# Patient Record
Sex: Male | Born: 1955
Health system: Southern US, Community
[De-identification: ages and names within clinical notes are randomized; demographics above are authoritative.]

## PROBLEM LIST (undated history)

## (undated) DIAGNOSIS — G4730 Sleep apnea, unspecified: Secondary | ICD-10-CM

## (undated) DIAGNOSIS — M545 Low back pain, unspecified: Secondary | ICD-10-CM

## (undated) DIAGNOSIS — K4090 Unilateral inguinal hernia, without obstruction or gangrene, not specified as recurrent: Secondary | ICD-10-CM

## (undated) DIAGNOSIS — M1990 Unspecified osteoarthritis, unspecified site: Secondary | ICD-10-CM

## (undated) DIAGNOSIS — S90811A Abrasion, right foot, initial encounter: Secondary | ICD-10-CM

## (undated) DIAGNOSIS — K219 Gastro-esophageal reflux disease without esophagitis: Secondary | ICD-10-CM

## (undated) DIAGNOSIS — Z8711 Personal history of peptic ulcer disease: Secondary | ICD-10-CM

## (undated) DIAGNOSIS — F419 Anxiety disorder, unspecified: Secondary | ICD-10-CM

## (undated) DIAGNOSIS — N400 Enlarged prostate without lower urinary tract symptoms: Secondary | ICD-10-CM

## (undated) DIAGNOSIS — G4733 Obstructive sleep apnea (adult) (pediatric): Secondary | ICD-10-CM

## (undated) DIAGNOSIS — K760 Fatty (change of) liver, not elsewhere classified: Secondary | ICD-10-CM

## (undated) DIAGNOSIS — F32A Depression, unspecified: Secondary | ICD-10-CM

## (undated) DIAGNOSIS — G8929 Other chronic pain: Secondary | ICD-10-CM

## (undated) DIAGNOSIS — D126 Benign neoplasm of colon, unspecified: Secondary | ICD-10-CM

## (undated) DIAGNOSIS — Z9189 Other specified personal risk factors, not elsewhere classified: Secondary | ICD-10-CM

## (undated) DIAGNOSIS — K602 Anal fissure, unspecified: Secondary | ICD-10-CM

## (undated) DIAGNOSIS — K5730 Diverticulosis of large intestine without perforation or abscess without bleeding: Secondary | ICD-10-CM

## (undated) DIAGNOSIS — Z8719 Personal history of other diseases of the digestive system: Secondary | ICD-10-CM

## (undated) DIAGNOSIS — Z860100 Personal history of colon polyps, unspecified: Secondary | ICD-10-CM

## (undated) DIAGNOSIS — K5090 Crohn's disease, unspecified, without complications: Secondary | ICD-10-CM

## (undated) HISTORY — DX: Fatty (change of) liver, not elsewhere classified: K76.0

## (undated) HISTORY — PX: EXCISIONAL HEMORRHOIDECTOMY: SHX1541

## (undated) HISTORY — DX: Unilateral inguinal hernia, without obstruction or gangrene, not specified as recurrent: K40.90

## (undated) HISTORY — PX: TONSILLECTOMY AND ADENOIDECTOMY: SUR1326

## (undated) HISTORY — DX: Sleep apnea, unspecified: G47.30

## (undated) HISTORY — DX: Unspecified osteoarthritis, unspecified site: M19.90

## (undated) HISTORY — DX: Anal fissure, unspecified: K60.2

## (undated) HISTORY — DX: Depression, unspecified: F32.A

## (undated) HISTORY — DX: Gastro-esophageal reflux disease without esophagitis: K21.9

## (undated) HISTORY — DX: Benign neoplasm of colon, unspecified: D12.6

## (undated) HISTORY — PX: KNEE ARTHROSCOPY: SUR90

---

## 1991-04-07 HISTORY — PX: OTHER SURGICAL HISTORY: SHX169

## 1992-04-06 HISTORY — PX: LUMBAR DISC SURGERY: SHX700

## 1998-04-06 HISTORY — PX: INCISIONAL HERNIA REPAIR: SHX193

## 1998-12-17 ENCOUNTER — Ambulatory Visit (HOSPITAL_COMMUNITY): Admission: RE | Admit: 1998-12-17 | Discharge: 1998-12-18 | Payer: Self-pay

## 1999-04-07 HISTORY — PX: POPLITEAL SYNOVIAL CYST EXCISION: SUR555

## 1999-11-04 ENCOUNTER — Encounter: Admission: RE | Admit: 1999-11-04 | Discharge: 1999-11-04 | Payer: Self-pay

## 1999-11-06 ENCOUNTER — Encounter: Admission: RE | Admit: 1999-11-06 | Discharge: 1999-11-06 | Payer: Self-pay

## 2001-04-15 ENCOUNTER — Encounter: Payer: Self-pay | Admitting: Internal Medicine

## 2006-07-14 ENCOUNTER — Ambulatory Visit: Payer: Self-pay | Admitting: Internal Medicine

## 2006-07-14 LAB — CONVERTED CEMR LAB
ALT: 28 units/L (ref 0–40)
AST: 29 units/L (ref 0–37)
Albumin: 4.4 g/dL (ref 3.5–5.2)
Alkaline Phosphatase: 58 units/L (ref 39–117)
BUN: 15 mg/dL (ref 6–23)
Basophils Absolute: 0.3 10*3/uL — ABNORMAL HIGH (ref 0.0–0.1)
Basophils Relative: 2.2 % — ABNORMAL HIGH (ref 0.0–1.0)
Bilirubin, Direct: 0.1 mg/dL (ref 0.0–0.3)
CO2: 32 meq/L (ref 19–32)
Calcium: 9.8 mg/dL (ref 8.4–10.5)
Chloride: 107 meq/L (ref 96–112)
Creatinine, Ser: 1 mg/dL (ref 0.4–1.5)
Eosinophils Absolute: 0.3 10*3/uL (ref 0.0–0.6)
Eosinophils Relative: 2.3 % (ref 0.0–5.0)
GFR calc Af Amer: 101 mL/min
GFR calc non Af Amer: 84 mL/min
Glucose, Bld: 97 mg/dL (ref 70–99)
HCT: 46.9 % (ref 39.0–52.0)
Hemoglobin: 16.1 g/dL (ref 13.0–17.0)
Lymphocytes Relative: 33.7 % (ref 12.0–46.0)
MCHC: 34.2 g/dL (ref 30.0–36.0)
MCV: 88.5 fL (ref 78.0–100.0)
Monocytes Absolute: 0.9 10*3/uL — ABNORMAL HIGH (ref 0.2–0.7)
Monocytes Relative: 7.8 % (ref 3.0–11.0)
Neutro Abs: 6.3 10*3/uL (ref 1.4–7.7)
Neutrophils Relative %: 54 % (ref 43.0–77.0)
Platelets: 261 10*3/uL (ref 150–400)
Potassium: 4 meq/L (ref 3.5–5.1)
RBC: 5.3 M/uL (ref 4.22–5.81)
RDW: 12.6 % (ref 11.5–14.6)
Sed Rate: 5 mm/hr (ref 0–20)
Sodium: 144 meq/L (ref 135–145)
TSH: 1.89 microintl units/mL (ref 0.35–5.50)
Total Bilirubin: 0.8 mg/dL (ref 0.3–1.2)
Total Protein: 7.1 g/dL (ref 6.0–8.3)
WBC: 11.8 10*3/uL — ABNORMAL HIGH (ref 4.5–10.5)

## 2006-07-20 ENCOUNTER — Ambulatory Visit: Payer: Self-pay | Admitting: *Deleted

## 2006-07-21 ENCOUNTER — Ambulatory Visit: Payer: Self-pay | Admitting: Internal Medicine

## 2009-01-01 ENCOUNTER — Telehealth: Payer: Self-pay | Admitting: Internal Medicine

## 2009-01-03 ENCOUNTER — Telehealth: Payer: Self-pay | Admitting: Internal Medicine

## 2009-01-23 DIAGNOSIS — K5090 Crohn's disease, unspecified, without complications: Secondary | ICD-10-CM | POA: Insufficient documentation

## 2009-01-23 DIAGNOSIS — K5730 Diverticulosis of large intestine without perforation or abscess without bleeding: Secondary | ICD-10-CM | POA: Insufficient documentation

## 2009-01-29 ENCOUNTER — Ambulatory Visit: Payer: Self-pay | Admitting: Internal Medicine

## 2009-01-29 DIAGNOSIS — K219 Gastro-esophageal reflux disease without esophagitis: Secondary | ICD-10-CM

## 2010-04-15 ENCOUNTER — Encounter: Payer: Self-pay | Admitting: Internal Medicine

## 2010-05-08 NOTE — Letter (Signed)
Summary: Colonoscopy Letter  [facility]  [place], [place] [address]   Phone: [phone]  Fax: [phone]      April 15, 2010 MRN: [id number]   ZYMARION FAVORITE [address] [place], [place]  [address]   Dear Mr. SCHWARTZ,   According to your medical record, it is time for you to schedule a Colonoscopy. The American Cancer Society recommends this procedure as a method to detect early colon cancer. Patients with a family history of colon cancer, or a personal history of colon polyps or inflammatory bowel disease are at increased risk.  This letter has been generated based on the recommendations made at the time of your procedure. If you feel that in your particular situation this may no longer apply, please contact our office.  Please call our office at [phone] to schedule this appointment or to update your records at your earliest convenience.  Thank you for cooperating with [place] to provide you with the very best care possible.   Sincerely,  Lowella Bandy. Olevia Perches, M.D.  [facility] Gastroenterology Division [phone]

## 2010-07-04 ENCOUNTER — Encounter (INDEPENDENT_AMBULATORY_CARE_PROVIDER_SITE_OTHER): Payer: Self-pay | Admitting: *Deleted

## 2010-07-08 NOTE — Letter (Signed)
Summary: Pre Visit Letter Revised  [facility]  [place], [place] [address]   Phone: [phone]  Fax: [phone]        07/04/2010 MRN: [id number] Micheal [name] [address] [place], [place]  [address]             Procedure Date:  07/21/2010 @ 3:30   recall colon-Dr. Olevia Perches   Welcome to the Gastroenterology Division at [facility].    You are scheduled to see a nurse for your pre-procedure visit on 07/09/2010 at 1:30 on the 3rd floor at [facility], [employer].  We ask that you try to arrive at our office 15 minutes prior to your appointment time to allow for check-in.  Please take a minute to review the attached form.  If you answer "Yes" to one or more of the questions on the first page, we ask that you call the person listed at your earliest opportunity.  If you answer "No" to all of the questions, please complete the rest of the form and bring it to your appointment.    Your nurse visit will consist of discussing your medical and surgical history, your immediate family medical history, and your medications.   If you are unable to list all of your medications on the form, please bring the medication bottles to your appointment and we will list them.  We will need to be aware of both prescribed and over the counter drugs.  We will need to know exact dosage information as well.    Please be prepared to read and sign documents such as consent forms, a financial agreement, and acknowledgement forms.  If necessary, and with your consent, a friend or relative is welcome to sit-in on the nurse visit with you.  Please bring your insurance card so that we may make a copy of it.  If your insurance requires a referral to see a specialist, please bring your referral form from your primary care physician.  No co-pay is required for this nurse visit.     If you cannot keep your appointment, please call [phone] to cancel or reschedule prior to your  appointment date.  This allows [place] the opportunity to schedule an appointment for another patient in need of care.    Thank you for choosing [facility] for your medical needs.  We appreciate the opportunity to care for you.  Please visit [place] at our website  to learn more about our practice.  Sincerely, The Gastroenterology Division

## 2010-07-09 ENCOUNTER — Ambulatory Visit (AMBULATORY_SURGERY_CENTER): Payer: PRIVATE HEALTH INSURANCE | Admitting: *Deleted

## 2010-07-09 VITALS — Ht 74.0 in | Wt 228.5 lb

## 2010-07-09 DIAGNOSIS — Z1211 Encounter for screening for malignant neoplasm of colon: Secondary | ICD-10-CM

## 2010-07-09 MED ORDER — PEG-KCL-NACL-NASULF-NA ASC-C 100 G PO SOLR: ORAL | Status: DC

## 2010-07-15 ENCOUNTER — Telehealth: Payer: Self-pay | Admitting: Internal Medicine

## 2010-07-15 MED ORDER — DICYCLOMINE HCL 20 MG PO TABS: ORAL_TABLET | ORAL | Status: DC

## 2010-07-15 NOTE — Telephone Encounter (Signed)
Patient calling to report for the last couple of weeks, left sided pain beside his belly button. Reports that pain is there all the time but his stomach makes lots of "noise when I eat." States bowel movements a couple of weeks ago were black but are normal now. Denies diarrhea, constipation, bleeding, nausea or vomiting. States he stopped taking Protonix when he lost his insurance. He has tried Prilosec but it did not help. Takes Percocet 10/340m every 4 hours as needed for pain (Dr. MNicholaus Bloom States he does have some supplemental insurance and is currently waiting on disaiblity to come through. He is scheduled for  Colonoscopy on 09/04/10. Hx Crohn's s/p post terminal ileum resection in 1993. Please, advise.

## 2010-07-15 NOTE — Telephone Encounter (Signed)
Patient given Dr. Nichola Sizer recommendations. Rx sent to pharmacy.

## 2010-07-15 NOTE — Telephone Encounter (Signed)
Please ask for him to take Activia yogurt or a probiotic, also call in Bentyl 20 mg, #20 , 1 po bid. If not better, will consider adding EGD to his colonoscopy in May 2012

## 2010-07-21 ENCOUNTER — Other Ambulatory Visit: Payer: Self-pay | Admitting: Internal Medicine

## 2010-07-29 ENCOUNTER — Other Ambulatory Visit: Payer: Self-pay | Admitting: Internal Medicine

## 2010-07-30 ENCOUNTER — Telehealth: Payer: Self-pay | Admitting: Internal Medicine

## 2010-07-30 ENCOUNTER — Ambulatory Visit (INDEPENDENT_AMBULATORY_CARE_PROVIDER_SITE_OTHER)
Admission: RE | Admit: 2010-07-30 | Discharge: 2010-07-30 | Disposition: A | Discharge status: Home / Self Care | Payer: PRIVATE HEALTH INSURANCE | Source: Ambulatory Visit | Attending: Internal Medicine | Admitting: Internal Medicine

## 2010-07-30 DIAGNOSIS — K5090 Crohn's disease, unspecified, without complications: Secondary | ICD-10-CM

## 2010-07-30 DIAGNOSIS — R109 Unspecified abdominal pain: Secondary | ICD-10-CM

## 2010-07-30 NOTE — Telephone Encounter (Signed)
Patient notified of Dr. Nichola Sizer recommendation. He will come today for KUB. Order in [facility]

## 2010-07-30 NOTE — Telephone Encounter (Signed)
Spoke with patient. He states that this AM, he is having sweats, chills and left lower abdominal pain. Denies bleeding, diarrhea, nausea. States he has had 2-3 small bowel movements this AM and feels almost like he is constipated. States he had vomiting x 1 this AM that was clear emesis. He is using Bentyl. Hx Crohn's disease with ileocolic anastomosis s/p terminal ileum resection 1993, s/p ventral hernia repair. Last colon- 04/15/01- no evidence recurrent Crohn's, diverticulosis. Patient scheduled with Nicoletta Ba, PA on 07/31/10 at 9:30 AM(Brodie supervising).

## 2010-07-30 NOTE — Telephone Encounter (Signed)
Please order KUB r/o SBO, " hx of Crohn's"

## 2010-07-31 ENCOUNTER — Other Ambulatory Visit (INDEPENDENT_AMBULATORY_CARE_PROVIDER_SITE_OTHER): Payer: PRIVATE HEALTH INSURANCE

## 2010-07-31 ENCOUNTER — Ambulatory Visit (INDEPENDENT_AMBULATORY_CARE_PROVIDER_SITE_OTHER): Payer: PRIVATE HEALTH INSURANCE | Admitting: Physician Assistant

## 2010-07-31 ENCOUNTER — Encounter: Payer: Self-pay | Admitting: Physician Assistant

## 2010-07-31 ENCOUNTER — Ambulatory Visit (INDEPENDENT_AMBULATORY_CARE_PROVIDER_SITE_OTHER)
Admission: RE | Admit: 2010-07-31 | Discharge: 2010-07-31 | Disposition: A | Discharge status: Home / Self Care | Payer: PRIVATE HEALTH INSURANCE | Source: Ambulatory Visit | Attending: Physician Assistant | Admitting: Physician Assistant

## 2010-07-31 VITALS — BP 132/80 | HR 74 | Temp 98.7°F | Ht 74.0 in | Wt 217.0 lb

## 2010-07-31 DIAGNOSIS — K5010 Crohn's disease of large intestine without complications: Secondary | ICD-10-CM

## 2010-07-31 DIAGNOSIS — R110 Nausea: Secondary | ICD-10-CM

## 2010-07-31 DIAGNOSIS — G8929 Other chronic pain: Secondary | ICD-10-CM | POA: Insufficient documentation

## 2010-07-31 DIAGNOSIS — N400 Enlarged prostate without lower urinary tract symptoms: Secondary | ICD-10-CM | POA: Insufficient documentation

## 2010-07-31 DIAGNOSIS — R634 Abnormal weight loss: Secondary | ICD-10-CM

## 2010-07-31 DIAGNOSIS — F419 Anxiety disorder, unspecified: Secondary | ICD-10-CM | POA: Insufficient documentation

## 2010-07-31 DIAGNOSIS — F411 Generalized anxiety disorder: Secondary | ICD-10-CM

## 2010-07-31 DIAGNOSIS — R1032 Left lower quadrant pain: Secondary | ICD-10-CM

## 2010-07-31 DIAGNOSIS — M549 Dorsalgia, unspecified: Secondary | ICD-10-CM

## 2010-07-31 LAB — CBC WITH DIFFERENTIAL/PLATELET
Basophils Relative: 0.3 % (ref 0.0–3.0)
Eosinophils Absolute: 0.1 10*3/uL (ref 0.0–0.7)
HCT: 47 % (ref 39.0–52.0)
Hemoglobin: 16.2 g/dL (ref 13.0–17.0)
Lymphs Abs: 3.6 10*3/uL (ref 0.7–4.0)
MCHC: 34.5 g/dL (ref 30.0–36.0)
MCV: 93.2 fl (ref 78.0–100.0)
Monocytes Absolute: 1.5 10*3/uL — ABNORMAL HIGH (ref 0.1–1.0)
Neutro Abs: 11.7 10*3/uL — ABNORMAL HIGH (ref 1.4–7.7)
Neutrophils Relative %: 69 % (ref 43.0–77.0)
RBC: 5.05 Mil/uL (ref 4.22–5.81)

## 2010-07-31 LAB — BASIC METABOLIC PANEL
CO2: 29 mEq/L (ref 19–32)
Calcium: 9.7 mg/dL (ref 8.4–10.5)
Chloride: 101 mEq/L (ref 96–112)
Glucose, Bld: 108 mg/dL — ABNORMAL HIGH (ref 70–99)
Sodium: 139 mEq/L (ref 135–145)

## 2010-07-31 MED ORDER — CIPROFLOXACIN HCL 500 MG PO TABS: 500.0000 mg | ORAL_TABLET | Freq: Two times a day (BID) | ORAL | Status: AC

## 2010-07-31 MED ORDER — METRONIDAZOLE 500 MG PO TABS: 500.0000 mg | ORAL_TABLET | Freq: Two times a day (BID) | ORAL | Status: AC

## 2010-07-31 MED ORDER — IOHEXOL 300 MG/ML  SOLN
100.0000 mL | Freq: Once | INTRAMUSCULAR | Status: AC | PRN
  Administered 2010-07-31: 100 mL via INTRAVENOUS

## 2010-07-31 NOTE — Progress Notes (Signed)
Reviewed and agree with management. Joylene Igo. MD [facility]

## 2010-07-31 NOTE — Progress Notes (Signed)
Subjective:    Patient ID: Micheal [name], male    DOB: Apr 03, 1956, 55 y.o.   MRN: [id number]  HPI Micheal [name] is a pleasant 55 year old white male known to Dr. Elicia Lamp. He was diagnosed with Crohn's ileitis in 1993 at which time he underwent resection of the terminal ileum. He also has history of diverticulosis. He has not had any problems with active Crohn since the time of his surgery. His last colonoscopy was done in 2003 again with no evidence of recurrent Crohn's. He was last seen here in October of 2010 and at that time was doing well, and advised to have followup colonoscopy this year. He is actually scheduled for followup colonoscopy may 28th.  Patient comes in today with onset of abdominal pain over the past 2-3 weeks. He says that discomfort has been fairly constant Sundays worse than others and worse over the past few days. He had onset 2 nights ago with sweats and chills but no documented fever. He says again last night he had sweats and chills and generally doesn't feel well. He has been fatigued his appetite has been decreased and he feels that he has an increase in pain after by mouth intake. He has lost about 8 pounds over the past 2 weeks. He is having bowel movements about is passing just a small amount of stool 3-4 times per day. He has not noted any melena or hematochezia. His pain is primarily in the left lower quadrant at this time. When he called he was set up for a KUB which was done yesterday and is not show any definite evidence of obstruction. He does feel nauseated at times but has not had any vomiting.    Review of Systems  Constitutional: Positive for appetite change, fatigue and unexpected weight change.  HENT: Negative.   Eyes: Negative.   Respiratory: Negative.   Cardiovascular: Negative.   Gastrointestinal: Positive for abdominal pain.  Genitourinary: Positive for difficulty urinating.  Musculoskeletal: Positive for back pain.  Skin: Negative.   Neurological:  Negative.   Hematological: Negative.   Psychiatric/Behavioral: Negative.    Outpatient Prescriptions Prior to Visit  Medication Sig Dispense Refill  . diazepam (VALIUM) 5 MG tablet Take 5 mg by mouth at bedtime as needed. Sometimes takes 2 tablets       . dicyclomine (BENTYL) 20 MG tablet TAKE 1 TABLET BY MOUTH TWICE Micheal [name]  20 tablet  0  . loratadine-pseudoephedrine (CLARITIN-D 24-HOUR) 10-240 MG per 24 hr tablet Take 1 tablet by mouth Micheal [name]. AS NEEDED      . omeprazole (PRILOSEC) 20 MG capsule Take 20 mg by mouth as needed.        Marland Kitchen oxyCODONE-acetaminophen (PERCOCET) 10-325 MG per tablet Take 1 tablet by mouth every 4 (four) hours as needed.        . Tamsulosin HCl (FLOMAX) 0.4 MG CAPS Take 0.4 mg by mouth at bedtime.        . peg 3350 powder (MOVIPREP) 100 G SOLR Movi Prep as directed  1 kit  0       Objective:   Physical Exam Well-developed white male in no acute distress, pleasant, alert and oriented x3 HEENT nontraumatic normocephalic EOMI PERRLA sclera anicteric  Neck; supple no JVD  Cardiovascula;r regular rate and rhythm with S1 and S2 no murmur rub or gallop Pulmonary; few scattered rhonchi  Abdomen ; soft nondistended, lower midline abdominal incisional scar, he is tender in the left lower quadrant and suprapubic area there is no  palpable mass or hepatosplenomegaly no guarding or rebound  Rectal; not done Skin benign warm and dry no lesion  Psych; mood and affect normal an appropriate       Assessment & Plan:  #7 55 year old white male with history of Crohn's ileocolitis status post resection in 1993 with no documented evidence of recurrent disease since. Now with 2-3 week history of somewhat progressive left lower quadrant pain, change in bowel habits weight loss and 2-3 day history of intermittent chills and sweats. Rule out active Crohn's, rule out diverticulitis.  Plan; Will check CBC CRP and be met today Schedule for CT scan of the abdomen and pelvis today if possible Start  Cipro 500 mg by mouth twice Micheal [name] x14 days Start Flagyl 500 mg twice Micheal [name] x14 days Advised for liquids a very soft diet, patient has Percocet which he uses for his back to use as needed for pain, further plans pending results of CT.

## 2010-07-31 NOTE — Patient Instructions (Signed)
Please go to the basement level to have your labs drawn.  We have scheduled the CT scan at [address] [employer]. Today 07-31-2010 at 2:30 PM. ARRIVE AT 2:15 PM Directions provided. We sent prescriptions for Cipro and Flagyl to [facility]

## 2010-08-01 ENCOUNTER — Telehealth: Payer: Self-pay | Admitting: *Deleted

## 2010-08-01 NOTE — Telephone Encounter (Signed)
Spoke with patient and he states he is feeling better today.  Scheduled patient for f/u on 08/13/10 2:30 PM with Dr. Olevia Perches.

## 2010-08-01 NOTE — Telephone Encounter (Signed)
Message copied by Leone Payor on Fri Aug 01, 2010  8:22 AM ------      Message from: [place], [place]      Created: Thu Jul 31, 2010  8:03 PM       Pt has diverticulitis. I believe pam  Called him with his ct report today. Please check on him tomorrow, and make sure he gets a follow up in 2 weeks with dr Olevia Perches

## 2010-08-11 ENCOUNTER — Encounter: Payer: Self-pay | Admitting: Internal Medicine

## 2010-08-13 ENCOUNTER — Ambulatory Visit (INDEPENDENT_AMBULATORY_CARE_PROVIDER_SITE_OTHER): Payer: PRIVATE HEALTH INSURANCE | Admitting: Internal Medicine

## 2010-08-13 ENCOUNTER — Encounter: Payer: Self-pay | Admitting: Internal Medicine

## 2010-08-13 VITALS — BP 134/80 | HR 62 | Ht 74.0 in | Wt 220.0 lb

## 2010-08-13 DIAGNOSIS — K5732 Diverticulitis of large intestine without perforation or abscess without bleeding: Secondary | ICD-10-CM

## 2010-08-13 DIAGNOSIS — R1032 Left lower quadrant pain: Secondary | ICD-10-CM

## 2010-08-13 DIAGNOSIS — K5010 Crohn's disease of large intestine without complications: Secondary | ICD-10-CM

## 2010-08-13 NOTE — Progress Notes (Signed)
Micheal [name] 28-May-1955 MRN [id number]   History of Present Illness:  This is a 55 year old, white male with Crohn's disease who is status post terminal ileal resection in 1993. He is also post ventral hernia repair. He presented with acute left lower quadrant abdominal pain on 07/31/2010 and was found to have acute diverticulitis on a CT scan of the abdomen and pelvis. There was no abscess but his white cell count was 17,000 and CRP was greater than 80. His last colonoscopy in 2003 showed no evidence of Crohn's disease. He was treated with Cipro and Flagyl for 2 weeks and has 90% resolution of his symptoms. His bowel habits have been regular. He has occasional dyspepsia relieved by PPI's. He goes to the pain clinic for chronic low back pain. He follows up with Dr. Hardin Negus. He is in the process of applying for disability.   Past Medical History  Diagnosis Date  . Allergy   . GERD (gastroesophageal reflux disease)   . Crohn's   . Diverticulosis of colon (without mention of hemorrhage)   . Pelvic abscess    Past Surgical History  Procedure Date  . Popliteal synovial cyst excision     both kness  . Colonoscopy   . Polypectomy   . Small intestine surgery   . Back surgery     L4/5  . Hernia repair     lt. groin  . Incisional hernia repair   . Rt. knee arthroscopy     x4  . Excisional hemorrhoidectomy   . Tonsillectomy and adenoidectomy   . Appendectomy   . Colon surgery      resection of terminal ileum/right colon 1993/crohns    reports that he has never smoked. He does not have any smokeless tobacco history on file. He reports that he drinks alcohol. He reports that he does not use illicit drugs. family history includes Cirrhosis in his mother and Heart disease in his father.  There is no history of Colon cancer. Allergies  Allergen Reactions  . Codeine     REACTION: itching        Review of Systems: Denies any dysphagia or odynophagia. Denies any shortness of breath or  chest pain. Positive for abdominal pain and small amount of residual tenderness in left lower quadrant.  The remainder of the 10  point ROS is negative except as outlined in H&P   Physical Exam: General appearance  Well developed, in no distress. Eyes- non icteric. HEENT nontraumatic, normocephalic. Mouth no lesions, tongue papillated, no cheilosis Neck supple without adenopathy, thyroid not enlarged, no carotid bruits, no JVD Lungs Clear to auscultation bilaterally Cor normal S1 normal S2, regular rhythm , no murmur,  quiet precordium Abdomen soft relaxed abdomen habits of ventral hernia above the umbilicus. Normoactive bowel sounds. Tenderness in left lower quadrant but no rebound or mass, liver edge at costal margin. Rectal: Soft Hemoccult negative stool. Extremities no pedal edema. Skin no lesions. Neurological alert and oriented x 3. Psychological normal mood and affect.  Assessment and    Problem #1 diverticulitis. He has symptomatically improved on Cipro and Flagyl. He will gradually increase fiber content of his diet. He will be finished with the antibiotics in 2 days. He is due for a repeat colonoscopy. We will await complete resolution of the tenderness before doing colonoscopy.   Problem #2 gastroesophageal reflux. Patient was given samples of Dexilant.  Problem #3 chronic back pain. Patient is to follow up in the pain clinic with Dr. Nicholaus Bloom.  08/13/2010 Delfin Edis

## 2010-08-13 NOTE — Patient Instructions (Addendum)
Follow up as needed CC: Dr Nicholaus Bloom, Dr Charleston Poot

## 2010-08-22 NOTE — Assessment & Plan Note (Signed)
[facility]                         GASTROENTEROLOGY OFFICE NOTE   JIBRIL, MCMINN                        MRN:          [id number]  DATE:07/14/2006                            DOB:          30-Mar-1956    Mr. Stults is a 55 year old gentleman with chronic disease of the  terminal ileum, status post terminal ileal resection by Dr. Leafy Kindle in  1993.  Last colonoscopy in 2003 did not show any evidence of recurrent  Crohn's disease.  He had a repair of ventral hernia in 1999.  He also  has a lumbar disk disease and was operated on by Dr. Ellene Route.  He is here  today because first of all, he received a recall letter for colonoscopy,  but he has also been losing weight from usual 216 pounds to about 190  pounds.  He also is having diarrhea, having loose stools during the day  and crampy abdominal pain.  He denies any rectal bleeding.  He has had  early satiety and dyspepsia.  We saw him last time in June, 2005.   MEDICATIONS:  Celebrex 200 mg a day for his degenerative joint disease.   PHYSICAL EXAMINATION:  VITAL SIGNS:  Blood pressure 140/90, pulse 76.  Weight 200 pounds.  GENERAL:  He was thinner than I saw him last time.  He was alert and  oriented.  HEENT:  Sclerae are anicteric.  NECK:  Supple.  LUNGS:  Clear to auscultation.  COR:  Normal S1, normal S2.  ABDOMEN:  Soft but very tender diffusely, mostly in the left lower and  left upper quadrant.  There were normoactive bowel sounds.  No fluid  wave.  The liver edge is at the costal margin, status post a well-healed  surgical scar in the right lower quadrant.  No CVA tenderness.  RECTAL:  Normal rectal tone.  Stool was hemoccult negative.  No perineal  disease.   IMPRESSION:  A 55 year old white male with Crohn's disease of the distal  small bowel, status post terminal ileal resection.  Last colonoscopy was  in 2003.  Patient now has symptoms which are suggestive of either  partial small bowel  obstruction or diarrhea due to bacterial overgrowth  or due to active Crohn's disease.  His weight loss may be related to  poor absorption or just active Crohn's disease.  He has not really taken  any preventative medications for his Crohn's.   PLAN:  1. CT scan of the abdomen and pelvis to look for active Crohn's      disease.  2. Start Cipro 250 po b.i.d.  Patient responded to it in the past.  3. Begin prednisone 20 mg daily.  He also responded to it in the past.  4. Today, CBC, CMET, sed rate.  5. I will need to see him in about six weeks.     Lowella Bandy. Olevia Perches, MD  Electronically Signed    DMB/MedQ  DD: 07/14/2006  DT: 07/15/2006  Job #: [id number]   cc:   Elta Guadeloupe L. Hardin Negus, M.D.

## 2010-09-03 ENCOUNTER — Telehealth: Payer: Self-pay | Admitting: Internal Medicine

## 2010-09-03 NOTE — Telephone Encounter (Signed)
No cancellation fee

## 2010-09-04 ENCOUNTER — Other Ambulatory Visit: Payer: Self-pay | Admitting: Internal Medicine

## 2010-11-20 NOTE — Progress Notes (Signed)
Addended by: Maxie Barb on: 11/20/2010 12:17 PM   Modules accepted: Orders, Level of Service

## 2011-07-13 ENCOUNTER — Other Ambulatory Visit: Payer: Self-pay | Admitting: Anesthesiology

## 2011-07-13 DIAGNOSIS — M5416 Radiculopathy, lumbar region: Secondary | ICD-10-CM

## 2011-07-15 ENCOUNTER — Ambulatory Visit
Admission: RE | Admit: 2011-07-15 | Discharge: 2011-07-15 | Disposition: A | Discharge status: Home / Self Care | Payer: Medicare Other | Source: Ambulatory Visit | Attending: Anesthesiology | Admitting: Anesthesiology

## 2011-07-15 DIAGNOSIS — M5416 Radiculopathy, lumbar region: Secondary | ICD-10-CM

## 2011-07-15 MED ORDER — GADOBENATE DIMEGLUMINE 529 MG/ML IV SOLN
20.0000 mL | Freq: Once | INTRAVENOUS | Status: AC | PRN
  Administered 2011-07-15: 20 mL via INTRAVENOUS

## 2011-09-22 ENCOUNTER — Other Ambulatory Visit: Payer: Self-pay | Admitting: *Deleted

## 2011-09-22 ENCOUNTER — Telehealth: Payer: Self-pay | Admitting: Internal Medicine

## 2011-09-22 DIAGNOSIS — K5790 Diverticulosis of intestine, part unspecified, without perforation or abscess without bleeding: Secondary | ICD-10-CM

## 2011-09-22 MED ORDER — METRONIDAZOLE 250 MG PO TABS: 250.0000 mg | ORAL_TABLET | Freq: Three times a day (TID) | ORAL | Status: AC

## 2011-09-22 MED ORDER — CIPROFLOXACIN HCL 500 MG PO TABS: 500.0000 mg | ORAL_TABLET | Freq: Two times a day (BID) | ORAL | Status: AC

## 2011-09-22 NOTE — Telephone Encounter (Signed)
Pl;ease send Cipro 563m po bid #20,, Flagyl 250 mg po tid x 10 days. #30. He will need to schedule colonoscopy within next 6 weeks.

## 2011-09-22 NOTE — Telephone Encounter (Signed)
Patient reports he ate strawberries a couple of weeks ago and now has abdominal pain. States the pain is bellow the belly button and to the left side. Report it wakes him up and is a nagging pain. Slight tender to touch. Reports indigestion and slightly constipated. Denies fever. States it is like the pain he had about a year ago with diverticulitis. He is asking for antibiotics for this. Please, advise.

## 2011-09-22 NOTE — Telephone Encounter (Signed)
rx sent to pharmacy. Patient aware. Scheduled patient for colonoscopy on 11/04/11 at 3:30 PM and pre visit on 10/14/11 at 11:00 AM.

## 2011-10-14 ENCOUNTER — Ambulatory Visit (AMBULATORY_SURGERY_CENTER): Payer: Medicare Other | Admitting: *Deleted

## 2011-10-14 VITALS — Ht 74.0 in | Wt 222.2 lb

## 2011-10-14 DIAGNOSIS — Z8719 Personal history of other diseases of the digestive system: Secondary | ICD-10-CM

## 2011-10-14 DIAGNOSIS — R1032 Left lower quadrant pain: Secondary | ICD-10-CM

## 2011-10-14 MED ORDER — MOVIPREP 100 G PO SOLR: ORAL | Status: DC

## 2011-11-04 ENCOUNTER — Ambulatory Visit (AMBULATORY_SURGERY_CENTER): Payer: Medicare Other | Admitting: Internal Medicine

## 2011-11-04 ENCOUNTER — Encounter: Payer: Self-pay | Admitting: Internal Medicine

## 2011-11-04 VITALS — BP 123/93 | HR 75 | Temp 96.0°F | Resp 19 | Ht 74.0 in | Wt 220.0 lb

## 2011-11-04 DIAGNOSIS — Z8719 Personal history of other diseases of the digestive system: Secondary | ICD-10-CM

## 2011-11-04 DIAGNOSIS — D126 Benign neoplasm of colon, unspecified: Secondary | ICD-10-CM

## 2011-11-04 DIAGNOSIS — K5090 Crohn's disease, unspecified, without complications: Secondary | ICD-10-CM

## 2011-11-04 DIAGNOSIS — R1032 Left lower quadrant pain: Secondary | ICD-10-CM

## 2011-11-04 DIAGNOSIS — K319 Disease of stomach and duodenum, unspecified: Secondary | ICD-10-CM

## 2011-11-04 MED ORDER — SODIUM CHLORIDE 0.9 % IV SOLN: 500.0000 mL | INTRAVENOUS | Status: DC

## 2011-11-04 NOTE — Op Note (Signed)
[place] [facility] [employer]. [place], [place]  [address]  COLONOSCOPY PROCEDURE REPORT  PATIENT:  Micheal [name], Micheal [name]  MR#:  [id number] BIRTHDATE:  07-Mar-1956, 89 yrs. old  GENDER:  male ENDOSCOPIST:  Lowella Bandy. Olevia Perches, MD REF. BY:  Florina Ou, M.D. PROCEDURE DATE:  11/04/2011 PROCEDURE:  Colonoscopy with biopsy ASA CLASS:  Class II INDICATIONS:  diverticulitis, colorectal cancer screening, average risk, Crohn's disease hx TI resection for Crohn's disease 1993, diverticulitis on CT scan [address],2013 last colon 2003- no active Crohn's MEDICATIONS:   MAC sedation, administered by CRNA, propofol (Diprivan) 350 mg  DESCRIPTION OF PROCEDURE:   After the risks and benefits and of the procedure were explained, informed consent was obtained. Digital rectal exam was performed and revealed no rectal masses. The LB [id number] endoscope was introduced through the anus and advanced to the anastomosis.  The quality of the prep was excellent, using MoviPrep.  The instrument was then slowly withdrawn as the colon was fully examined. <<PROCEDUREIMAGES>>  FINDINGS:  The right colon was surgically resected and an ileo-colonic anastamosis was seen. widely patent anastomosis With standard forceps, biopsy was obtained and sent to pathology (see image3 and image6).  Three polyps were found. 3-8 mm sessile polyp right colon and one at 60 cm With hot biopsy forceps, biopsy was obtained and sent to pathology (see image8, image9, and image4). Severe diverticulosis was found in the sigmoid colon (see image1, image2, and image10). severe diverticular disease sigmoid colon, lumen narrowed, deep diverticuli also mild diverticulosis in the right colon  This was otherwise a normal examination of the colon (see image11, image6, and image3). Retroflexed views in the rectum revealed no abnormalities.    The scope was then withdrawn from the patient and the procedure completed.  COMPLICATIONS:   None ENDOSCOPIC IMPRESSION: 1) Prior right hemi-colectomy 2) Three polyps 3) Severe diverticulosis in the sigmoid colon 4) Otherwise normal examination RECOMMENDATIONS: 1) Await pathology results metamucil 1 tsp daily high fiber diet may need sigmoid resection - lap assisted if diverticulitis recurrs.  REPEAT EXAM:  In 5 year(s) for.  ______________________________ Lowella Bandy. Olevia Perches, MD  CC:  n. eSIGNED:   Lowella Bandy. Dominyk Law at 11/04/2011 04:05 PM  Silvio Clayman, [phone]

## 2011-11-04 NOTE — Progress Notes (Signed)
Patient did not experience any of the following events: a burn prior to discharge; a fall within the facility; wrong site/side/patient/procedure/implant event; or a hospital transfer or hospital admission upon discharge from the facility. (G8907) Patient did not have preoperative order for IV antibiotic SSI prophylaxis. (G8918)  

## 2011-11-04 NOTE — Patient Instructions (Addendum)
YOU HAD AN ENDOSCOPIC PROCEDURE TODAY AT [facility]: Refer to the procedure report that was given to you for any specific questions about what was found during the examination.  If the procedure report does not answer your questions, please call your gastroenterologist to clarify.  If you requested that your care partner not be given the details of your procedure findings, then the procedure report has been included in a sealed envelope for you to review at your convenience later.  YOU SHOULD EXPECT: Some feelings of bloating in the abdomen. Passage of more gas than usual.  Walking can help get rid of the air that was put into your GI tract during the procedure and reduce the bloating. If you had a lower endoscopy (such as a colonoscopy or flexible sigmoidoscopy) you may notice spotting of blood in your stool or on the toilet paper. If you underwent a bowel prep for your procedure, then you may not have a normal bowel movement for a few days.  DIET: Your first meal following the procedure should be a light meal and then it is ok to progress to your normal diet.  A half-sandwich or bowl of soup is an example of a good first meal.  Heavy or fried foods are harder to digest and may make you feel nauseous or bloated.  Likewise meals heavy in dairy and vegetables can cause extra gas to form and this can also increase the bloating.  Drink plenty of fluids but you should avoid alcoholic beverages for 24 hours.  ACTIVITY: Your care partner should take you home directly after the procedure.  You should plan to take it easy, moving slowly for the rest of the day.  You can resume normal activity the day after the procedure however you should NOT DRIVE or use heavy machinery for 24 hours (because of the sedation medicines used during the test).    SYMPTOMS TO REPORT IMMEDIATELY: A gastroenterologist can be reached at any hour.  During normal business hours, 8:30 AM to 5:00 PM Monday through Friday,  call [phone].  After hours and on weekends, please call the GI answering service at [phone] who will take a message and have the physician on call contact you.   Following lower endoscopy (colonoscopy or flexible sigmoidoscopy):  Excessive amounts of blood in the stool  Significant tenderness or worsening of abdominal pains  Swelling of the abdomen that is new, acute  Fever of 100F or higher FOLLOW UP: If any biopsies were taken you will be contacted by phone or by letter within the next 1-3 weeks.  Call your gastroenterologist if you have not heard about the biopsies in 3 weeks.  Our staff will call the home number listed on your records the next business day following your procedure to check on you and address any questions or concerns that you may have at that time regarding the information given to you following your procedure. This is a courtesy call and so if there is no answer at the home number and we have not heard from you through the emergency physician on call, we will assume that you have returned to your regular daily activities without incident.  SIGNATURES/CONFIDENTIALITY: You and/or your care partner have signed paperwork which will be entered into your electronic medical record.  These signatures attest to the fact that that the information above on your After Visit Summary has been reviewed and is understood.  Full responsibility of the confidentiality of this discharge  information lies with you and/or your care-partner.   Ok to resume your normal medications  Take 1 teaspoon Metamucil daily  Follow high fiber diet- see handout

## 2011-11-05 ENCOUNTER — Telehealth: Payer: Self-pay | Admitting: *Deleted

## 2011-11-05 NOTE — Telephone Encounter (Signed)
  Follow up Call-  Call back number 11/04/2011  Post procedure Call Back phone  # [phone]  Permission to leave phone message Yes     Patient questions:  Do you have a fever, pain , or abdominal swelling? no Pain Score  0 *  Have you tolerated food without any problems? yes  Have you been able to return to your normal activities? yes  Do you have any questions about your discharge instructions: Diet   no Medications  no Follow up visit  no  Do you have questions or concerns about your Care? no  Actions: * If pain score is 4 or above: No action needed, pain <4.

## 2011-11-10 ENCOUNTER — Encounter: Payer: Self-pay | Admitting: Internal Medicine

## 2012-03-11 ENCOUNTER — Telehealth: Payer: Self-pay | Admitting: Internal Medicine

## 2012-03-11 MED ORDER — METRONIDAZOLE 250 MG PO TABS: ORAL_TABLET | ORAL | Status: DC

## 2012-03-11 MED ORDER — CIPROFLOXACIN HCL 500 MG PO TABS: ORAL_TABLET | ORAL | Status: DC

## 2012-03-11 NOTE — Telephone Encounter (Signed)
Spoke with patient and he is having LLQ pain that is knife like when he bends. He also feels bloated. Pain started before Thanksgiving. He is eating bland foods. Denies chills, fever, constipation or diarrhea. He is asking for rx for antibiotics. Please, advise.

## 2012-03-11 NOTE — Telephone Encounter (Signed)
Cipro 590m po bid, #20, Flagyl 250 mg, #30 1 po tid x 10 days

## 2012-03-11 NOTE — Telephone Encounter (Signed)
rx sent. Patient aware.

## 2012-03-28 ENCOUNTER — Telehealth: Payer: Self-pay | Admitting: Internal Medicine

## 2012-03-28 MED ORDER — CIPROFLOXACIN HCL 500 MG PO TABS: ORAL_TABLET | ORAL | Status: DC

## 2012-03-28 MED ORDER — METRONIDAZOLE 250 MG PO TABS: ORAL_TABLET | ORAL | Status: DC

## 2012-03-28 NOTE — Telephone Encounter (Signed)
Patient calling to report that he completed his Cipro and Flagyl for diverticulitis about 4 days ago. Last colonoscopy, 11/04/11- prior Right hemicolectomy, severe diverticulosis. He states he still has LLQ pain that is worse in the afternoon. States he can lay down and rest and the pain gets better. He is on a light diet. Denies fever. He states he does not want to come in until after Christmas. He is on Percocet for back pain which is managed by Dr. Hardin Negus. He has an appointment with Dr. Hardin Negus on Friday and wants to come for OV here that day too if possible. Scheduled patient with Nicoletta Ba, PA on 04/01/12 at 9:30 AM. Patient is asking if he should be back on antibiotics prior to [place]. Please, advise. DOD- Dr Henrene Pastor.

## 2012-03-28 NOTE — Telephone Encounter (Signed)
Treat with additional 7 days of Cipro and Flagyl as previously ordered. Keep appointment with Amy this week

## 2012-03-28 NOTE — Telephone Encounter (Signed)
Rx sent to pharmacy. Patient aware and he will keep OV.

## 2012-03-31 ENCOUNTER — Telehealth: Payer: Self-pay | Admitting: Internal Medicine

## 2012-03-31 DIAGNOSIS — R109 Unspecified abdominal pain: Secondary | ICD-10-CM

## 2012-03-31 NOTE — Addendum Note (Signed)
Addended by: Marlon Pel on: 03/31/2012 03:12 PM   Modules accepted: Orders

## 2012-03-31 NOTE — Telephone Encounter (Signed)
Patient's wife thinks her husband needs lab work prior to the office visit with Nicoletta Ba PA tomorrow.  She reports no improvement in his symptoms since starting on antibiotics on 03/28/12 (see phone note from 03/28/12).  I did call and speak patient reports minimal improvement since Monday.  He does report that he is tender to the touch, but not as bad as it was prior to Cipro and flagyl on Monday.  He denies fever, nausea, or vomiting.  He is able to tolerate a bland liquid diet and is having daily BM.  He will come in for CBC and CMET prior to the appt tomorrow per Nicoletta Ba PA.  He was offered an appt for today, but declined due to the time of day and he was trying to coordinate the appt with his pain clinic appt tomorrow.

## 2012-04-01 ENCOUNTER — Ambulatory Visit (INDEPENDENT_AMBULATORY_CARE_PROVIDER_SITE_OTHER): Payer: Medicare Other | Admitting: Physician Assistant

## 2012-04-01 ENCOUNTER — Ambulatory Visit (INDEPENDENT_AMBULATORY_CARE_PROVIDER_SITE_OTHER)
Admission: RE | Admit: 2012-04-01 | Discharge: 2012-04-01 | Disposition: A | Discharge status: Home / Self Care | Payer: Medicare Other | Source: Ambulatory Visit | Attending: Physician Assistant | Admitting: Physician Assistant

## 2012-04-01 ENCOUNTER — Encounter: Payer: Self-pay | Admitting: Physician Assistant

## 2012-04-01 ENCOUNTER — Other Ambulatory Visit (INDEPENDENT_AMBULATORY_CARE_PROVIDER_SITE_OTHER): Payer: Medicare Other

## 2012-04-01 VITALS — BP 130/80 | HR 72 | Ht 74.0 in | Wt 218.2 lb

## 2012-04-01 DIAGNOSIS — R1032 Left lower quadrant pain: Secondary | ICD-10-CM

## 2012-04-01 DIAGNOSIS — Z8719 Personal history of other diseases of the digestive system: Secondary | ICD-10-CM

## 2012-04-01 DIAGNOSIS — R52 Pain, unspecified: Secondary | ICD-10-CM

## 2012-04-01 DIAGNOSIS — Z9049 Acquired absence of other specified parts of digestive tract: Secondary | ICD-10-CM | POA: Insufficient documentation

## 2012-04-01 DIAGNOSIS — R109 Unspecified abdominal pain: Secondary | ICD-10-CM

## 2012-04-01 DIAGNOSIS — Z9889 Other specified postprocedural states: Secondary | ICD-10-CM

## 2012-04-01 DIAGNOSIS — Z86010 Personal history of colonic polyps: Secondary | ICD-10-CM | POA: Insufficient documentation

## 2012-04-01 LAB — CBC WITH DIFFERENTIAL/PLATELET
Basophils Absolute: 0.1 10*3/uL (ref 0.0–0.1)
Eosinophils Absolute: 0.4 10*3/uL (ref 0.0–0.7)
Hemoglobin: 16 g/dL (ref 13.0–17.0)
Lymphocytes Relative: 40.6 % (ref 12.0–46.0)
Lymphs Abs: 4.3 10*3/uL — ABNORMAL HIGH (ref 0.7–4.0)
MCHC: 34.2 g/dL (ref 30.0–36.0)
Monocytes Relative: 8.1 % (ref 3.0–12.0)
Neutro Abs: 4.9 10*3/uL (ref 1.4–7.7)
Platelets: 242 10*3/uL (ref 150.0–400.0)
RDW: 13.1 % (ref 11.5–14.6)

## 2012-04-01 LAB — COMPREHENSIVE METABOLIC PANEL
ALT: 41 U/L (ref 0–53)
AST: 36 U/L (ref 0–37)
Creatinine, Ser: 1 mg/dL (ref 0.4–1.5)
Sodium: 139 mEq/L (ref 135–145)
Total Bilirubin: 0.6 mg/dL (ref 0.3–1.2)
Total Protein: 7.4 g/dL (ref 6.0–8.3)

## 2012-04-01 MED ORDER — CIPROFLOXACIN HCL 500 MG PO TABS: ORAL_TABLET | ORAL | Status: DC

## 2012-04-01 MED ORDER — IOHEXOL 300 MG/ML  SOLN
100.0000 mL | Freq: Once | INTRAMUSCULAR | Status: AC | PRN
  Administered 2012-04-01: 100 mL via INTRAVENOUS

## 2012-04-01 MED ORDER — METRONIDAZOLE 250 MG PO TABS: ORAL_TABLET | ORAL | Status: DC

## 2012-04-01 NOTE — Patient Instructions (Addendum)
We sent 4 more days of the Cipro and Flagyl to [facility].  You have been scheduled for a CT scan of the abdomen and pelvis at [facility] ([address]---this is in the same building as Press [facility]).   You are scheduled on 04-01-2012 today at 4:00 PM . You should arrive 15 minutes prior to your appointment time for registration. Please follow the written instructions below on the day of your exam:  WARNING: IF YOU ARE ALLERGIC TO IODINE/X-RAY DYE, PLEASE NOTIFY RADIOLOGY IMMEDIATELY AT [phone]! YOU WILL BE GIVEN A 13 HOUR PREMEDICATION PREP.  1) Do not eat or drink anything after Noon 12:00 . (4 hours prior to your test) 2) You have been given 2 bottles of oral contrast to drink. The solution may taste  better if refrigerated, but do NOT add ice or any other liquid to this solution. Shake  well before drinking.    Drink 1 bottle of contrast @ 2:00 Pm  (2 hours prior to your exam)  Drink 1 bottle of contrast @ 3:00 PM  (1 hour prior to your exam)  You may take any medications as prescribed with a small amount of water except for the following: Metformin, Glucophage, Glucovance, Avandamet, Riomet, Fortamet, Actoplus Met, Janumet, Glumetza or Metaglip. The above medications must be held the day of the exam AND 48 hours after the exam.  The purpose of you drinking the oral contrast is to aid in the visualization of your intestinal tract. The contrast solution may cause some diarrhea. Before your exam is started, you will be given a small amount of fluid to drink. Depending on your individual set of symptoms, you may also receive an intravenous injection of x-ray contrast/dye. Plan on being at [facility] for 30 minutes or long, depending on the type of exam you are having performed.  If you have any questions regarding your exam or if you need to reschedule, you may call the CT department at [phone] between the hours of 8:00 am and 5:00 pm,  Monday-Friday.  ________________________________________________________________________

## 2012-04-01 NOTE — Progress Notes (Signed)
Subjective:    Patient ID: Micheal [name], male    DOB: 07/03/1955, 56 y.o.   MRN: [id number]  HPI  Micheal [name] is 51 and very nice 56 year old white male known to Dr. Delfin Edis who has history of recurrent diverticulitis. He was seen twice in 2012 with episodes of diverticulitis. Colonoscopy in July of 2013 it did show severe diverticulosis of the sigmoid colon with narrowing of the lumen and deep diverticuli- he also had 3 polyps removed 2 of which were tubular adenomas. He has evidence of prior right hemicolectomy with widely patent anastomosis. Patient had called your 03/18/2012 with left lower quadrant pain similar to his prior episodes of diverticulitis and was started on a course of Cipro 500 and Flagyl 500 both twice daily x10 days. He completed his course and then called back stating that he was still having pain and was started on another seven-day course of Cipro and Flagyl . His wife called yesterday concerned that he was still having pain and in fact seemed to be having increased pain. He is brought in today for reevaluation. Patient states that he did have some mild improvement with the initial course of antibiotics but his pain certainly did not resolve and now that he is back on antibiotics  he says he feels a little bit better but still has significant discomfort. He is more uncomfortable with walking and activity, and has been spending a lot of time  On the couch due to discomfort. He is having episodes of very sharp pain in the left lower quadrant as well. He has not had any fever chills or sweats. His appetite has been decreased and he is eating very light. He is having fairly normal bowel movements and has not noted any melena or hematochezia. He denies any current urinary symptoms other than hesitancy which has been an ongoing issue.    Review of Systems  Constitutional: Positive for activity change and appetite change.  HENT: Negative.   Eyes: Negative.   Respiratory: Negative.     Cardiovascular: Negative.   Gastrointestinal: Positive for abdominal pain.  Genitourinary: Negative.   Musculoskeletal: Positive for back pain.  Neurological: Negative.   Hematological: Negative.   Psychiatric/Behavioral: Negative.    Outpatient Prescriptions Prior to Visit  Medication Sig Dispense Refill  . oxyCODONE-acetaminophen (PERCOCET) 10-325 MG per tablet Take 1 tablet by mouth every 4 (four) hours as needed.        . pantoprazole (PROTONIX) 40 MG tablet Take 40 mg by mouth daily.       . Tamsulosin HCl (FLOMAX) 0.4 MG CAPS Take 0.4 mg by mouth at bedtime.        . [DISCONTINUED] ciprofloxacin (CIPRO) 500 MG tablet Take one po BID x 7 days  14 tablet  0  . [DISCONTINUED] metroNIDAZOLE (FLAGYL) 250 MG tablet Take one po TID x 7 days  21 tablet  0  . [DISCONTINUED] diazepam (VALIUM) 5 MG tablet Take 5 mg by mouth at bedtime as needed. Sometimes takes 2 tablets       . [DISCONTINUED] dicyclomine (BENTYL) 20 MG tablet TAKE 1 TABLET BY MOUTH TWICE DAILY  20 tablet  0  . [DISCONTINUED] loratadine-pseudoephedrine (CLARITIN-D 24-HOUR) 10-240 MG per 24 hr tablet Take 1 tablet by mouth daily. AS NEEDED       Last reviewed on 04/01/2012 10:56 AM by Alfredia Ferguson, PA  Allergies  Allergen Reactions  . Codeine     REACTION: itching   Patient Active Problem List  Diagnosis  . GERD  . CROHN'S DISEASE  . DIVERTICULOSIS OF COLON  . Anxiety  . BPH (benign prostatic hypertrophy)  . Chronic back pain  . S/P right hemicolectomy  . Hx of adenomatous colonic polyps   History  Substance Use Topics  . Smoking status: Never Smoker   . Smokeless tobacco: Never Used  . Alcohol Use: No       Objective:   Physical Exam  well-developed older white male in no acute distress, accompanied by his wife. Blood pressure 130/80 pulse 72 height 6 foot 2 weight 218  HEENT; nontraumatic normocephalic EOMI PERRLA sclera anicteric,Neck; Supple no JVD, Cardiovascular; regular rate and rhythm with S1-S2  no murmur or gallop, Pulmonary ;clear bilaterally, Abdomen; soft, he is quite tender in the left lower order and no palpable mass or hepatosplenomegaly there is no guarding or rebound, bowel sounds are active, Rectal; exam not done, Extremities; no clubbing cyanosis or edema skin warm and dry, Psych; mood and affect normal and appropriate.        Assessment & Plan:  #44 56 year old male with severe sigmoid diverticulosis and luminal narrowing with history of recurrent diverticulitis now with persistent left-sided abdominal pain despite 2-1/2 weeks of Cipro and Flagyl. Rule out refractory diverticulitis, rule out complicated diverticulitis. Patient also has remote history of Crohn's disease for which he underwent a resection and this needs to be considered in the differential as well. #2 status post right hemicolectomy and terminal ileal resection in 1993 #3 history of adenomatous colon polyps-last colonoscopy July 2013 #4 BPH  Plan; CBC and CMET  were done this morning and are unremarkable We'll continue Cipro 500 mg by mouth twice daily and Flagyl 500 mg by mouth twice daily and extend out to a 10 day course Schedule for CT scan of the abdomen and pelvis today-further plans pending results of CT

## 2012-04-01 NOTE — Progress Notes (Signed)
Reviewed, I agree that complicated diverticulitis is a likely possibility, also "chronic dysfunctional colon" due to diverticular disease. If pain persists without radiographic changes, consider an alternative approach ( surgical consult)

## 2012-04-11 ENCOUNTER — Encounter: Payer: Self-pay | Admitting: *Deleted

## 2012-05-03 ENCOUNTER — Telehealth: Payer: Self-pay | Admitting: Internal Medicine

## 2012-05-03 NOTE — Telephone Encounter (Signed)
Patient calling to report that since Thanksgiving, he has been having abdominal pain every time he eats. States when he saw Nicoletta Ba, PA and took the antibiotics, it got a little better but not much. Reports he has been eating soft/bland foods and when the food gets to his colon, he has pain. He can lay down and the pain is less. He is taking Miralax and is having bowel movements that he describes as "little chips."  Denies fever.He is scheduled for OV on 1/06/04/12 and he knows he needs to see a Psychologist, sport and exercise. He wanted to call and see if the referral could be set up.Please, advise.

## 2012-05-04 ENCOUNTER — Other Ambulatory Visit: Payer: Self-pay | Admitting: *Deleted

## 2012-05-04 DIAGNOSIS — K5090 Crohn's disease, unspecified, without complications: Secondary | ICD-10-CM

## 2012-05-04 DIAGNOSIS — K5792 Diverticulitis of intestine, part unspecified, without perforation or abscess without bleeding: Secondary | ICD-10-CM

## 2012-05-04 NOTE — Telephone Encounter (Signed)
Scheduled patient with Dr. Zella Richer on 05/26/12 at 9:00 AM. Patient aware.

## 2012-05-04 NOTE — Telephone Encounter (Signed)
I have spoken to the Micheal [name] and I will see him in the office later this week but it is clear to me that he will need a sigmoid resection. Could you, please set him up with Dr Zella Richer and I will send him my OV note on 05/06/2012

## 2012-05-06 ENCOUNTER — Encounter: Payer: Self-pay | Admitting: Internal Medicine

## 2012-05-06 ENCOUNTER — Ambulatory Visit (INDEPENDENT_AMBULATORY_CARE_PROVIDER_SITE_OTHER): Payer: Medicare Other | Admitting: Internal Medicine

## 2012-05-06 VITALS — BP 108/66 | HR 88 | Ht 72.75 in | Wt 222.0 lb

## 2012-05-06 DIAGNOSIS — K5732 Diverticulitis of large intestine without perforation or abscess without bleeding: Secondary | ICD-10-CM

## 2012-05-06 DIAGNOSIS — K5010 Crohn's disease of large intestine without complications: Secondary | ICD-10-CM

## 2012-05-06 DIAGNOSIS — K5792 Diverticulitis of intestine, part unspecified, without perforation or abscess without bleeding: Secondary | ICD-10-CM

## 2012-05-06 DIAGNOSIS — R1032 Left lower quadrant pain: Secondary | ICD-10-CM

## 2012-05-06 MED ORDER — AMOXICILLIN-POT CLAVULANATE 875-125 MG PO TABS: 1.0000 | ORAL_TABLET | Freq: Every day | ORAL | Status: DC

## 2012-05-06 NOTE — Progress Notes (Signed)
ARNALDO HEFFRON 1955-12-15 MRN [id number]   History of Present Illness:  This is a 57 year old white male with Crohn's disease , severe diverticulosis of the sigmoid colon and several documented episodes of diverticulitis. The most recent one started 4 weeks ago. He had 2 episodes in 2012. His last colonoscopy in July 2013 showed severe diverticulosis of the sigmoid colon with narrow lumen, tortuosity and partial obstruction. His ileocolic anastomosis from 1993  was widely patent and there was no evidence of active Crohn's disease. He had 3 polyps, one a tubular adenoma. A CT scan of the abdomen in January 2014 showed diverticulosis without acute inflammation. He had stable bilateral inguinal hernias and fatty liver. The LLQ abd. pain pain bothers him  2 hours postprandially and usually subsides if he lays down. He has been on a liquid diet for the past 2 weeks. Patient has a history of Crohn's disease since 1993 when he underwent a right hemicolectomy and terminal ileal resection by Dr Leafy Kindle. He subsequently underwent repair of the right inguinal hernia and ventral hernia with placement of a mesh ten years later.   Past Medical History  Diagnosis Date  . Allergy   . GERD (gastroesophageal reflux disease)   . Crohn's   . Diverticulosis of colon (without mention of hemorrhage)   . Pelvic abscess   . Arthritis   . Tubular adenoma of colon 11/04/11    x 2   Past Surgical History  Procedure Date  . Popliteal synovial cyst excision     both kness  . Back surgery     L4/5  . Hernia repair     lt. groin  . Incisional hernia repair   . Rt. knee arthroscopy     x4  . Excisional hemorrhoidectomy   . Tonsillectomy and adenoidectomy   . Appendectomy   . Colon surgery      resection of terminal ileum/right colon 1993/crohns    reports that he has never smoked. He has never used smokeless tobacco. He reports that he does not drink alcohol or use illicit drugs. family history includes Cirrhosis  in his mother and Heart disease in his father.  There is no history of Colon cancer. Allergies  Allergen Reactions  . Codeine     REACTION: itching        Review of Systems: Denies rectal bleeding, weight loss, fever  The remainder of the 10 point ROS is negative except as outlined in H&P   Physical Exam: General appearance  Well developed, in no distress. Eyes- non icteric. HEENT nontraumatic, normocephalic. Mouth no lesions, tongue papillated, no cheilosis. Neck supple without adenopathy, thyroid not enlarged, no carotid bruits, no JVD. Lungs Clear to auscultation bilaterally. Cor normal S1, normal S2, regular rhythm, no murmur,  quiet precordium. Abdomen: Very tender left lower quadrant without rebound. I cannot appreciate any inguinal hernia. There is a well-healed surgical scar in the right groin. Bowel sounds are normal active. There is no palpable mass. Rectal: Soft Hemoccult negative stool. Extremities no pedal edema. Skin no lesions. Neurological alert and oriented x 3. Psychological normal mood and affect.  Assessment and Plan:  Problem #1 Symptomatic diverticular disease involving the sigmoid colon resulting in partial narrowing and chronic pain. He has episodes of diverticulitis. He is an appropriate candidate for consideration of laparoscopically assisted sigmoid resection. Presence of mesh in his abdomen and prior laparotomy may be an obstacle in a laparoscopic approach.  We will obtain a barium enema to determine the extent of  his diverticulosis. He has an appointment for surgical consultation on February 20th. In the meantime, he will stay on a low residue diet and liquids and I will start Augmentin 875 mg daily for 10 days. If his condition deteriorates, then he may have to go to the emergency room.  Problem #2 Crohn's disease of the terminal ileum. Patient is status post remote resection with right hemicolectomy in 1993. As of his last colonoscopy, there was no  evidence of active Crohn's disease.  Problem #3 Bilateral inguinal hernia by CT scan, not clinically noticeable.   05/06/2012 Delfin Edis

## 2012-05-06 NOTE — Patient Instructions (Addendum)
You have been scheduled for a Barium Enema at [facility] (1st floor of the hospital) on 05/10/12 at 11:00 am. Please arrive 15 minutes prior to your appointment for registration. Make certain not to have anything to eat or drink 6 hours prior to your test. If you need to reschedule for any reason, please contact radiology at [phone] to do so. __________________________________________________________________ Please go to [facility] after leaving the office today to pick up your prep for Barium Enema.   We have sent the following medications to your pharmacy for you to pick up at your convenience: Augmentin  Low-Fiber Diet Fiber is found in fruits, vegetables, and grains. A low-fiber diet restricts fibrous foods that are not digested in the small intestine. A diet containing about 10 grams of fiber is considered low fiber.  PURPOSE  To prevent blockage of a partially obstructed or narrowed gastrointestinal tract.  To reduce fecal weight and volume.  To slow the movement of feces. WHEN IS THIS DIET USED?  It may be used during the acute phase of Crohn disease, ulcerative colitis, regional enteritis, or diverticulitis.  It may be used if your intestinal or esophageal tubes are narrowing (stenosis).  It may be used as a transitional diet following surgery, injury (trauma), or illness. CHOOSING FOODS Check labels, especially on foods from the starch list. Often times, dietary fiber content is listed on the nutrition facts panel. Please ask your Registered Dietitian if you have questions about specific foods that are related to your condition, especially if the food is not listed on this handout. Breads and Starches  Allowed: White, [place], and pita breads, plain rolls, buns, or sweet rolls, doughnuts, waffles, pancakes, bagels. Plain muffins, biscuits, matzoth. Soda, saltine, graham crackers. Pretzels, rusks, melba toast, zwieback. Cooked cereals: cornmeal, farina, or  cream cereals. Dry cereals: refined corn, wheat, rice, and oat cereals (check label). Potatoes prepared any way without skins, refined macaroni, spaghetti, noodles, refined rice.  Avoid: Whole-wheat bread, rolls, and crackers. Multigrains, rye, bran seeds, nuts, or coconut. Cereals containing whole grains, multigrains, bran, coconut, nuts, raisins. Cooked or dry oatmeal. Coarse wheat cereals, granola. Cereals advertised as "high fiber." Potato skins. Whole-grain pasta, wild or brown rice. Popcorn. Vegetables  Allowed: Strained tomato and vegetable juices. Fresh lettuce, cucumber, spinach. Well-cooked or canned: asparagus, bean sprouts, broccoli, cut green beans, cauliflower, pumpkin, beets, mushrooms, olives, yellow squash, tomato, tomato sauce, zucchini, turnips.Keep servings limited to  cup.  Avoid: Fresh, cooked, or canned: artichokes, baked beans, beet greens, Brussels sprouts, corn, kale, legumes, peas, sweet potatoes. Avoid large servings of any vegetables. Fruit  Allowed: All fruit juices except prune juice. Cooked or canned fruits without skin and seeds: apricots, applesauce, cantaloupe, cherries, grapefruit, grapes, kiwi, mandarin oranges, peaches, pears, fruit cocktail, pineapple, plums, watermelon. Fresh without skin: banana, grapes, cantaloupe, avocado, cherries, pineapple, kiwi, nectarines, peaches, blueberries. Keep servings limited to  cup or 1 piece.  Avoid: Fresh: apples with or without skin, apricots, mangoes, pears, raspberries, strawberries. Prune juice and juices with pulp, stewed or dried prunes. Dried fruits, raisins, dates. Avoid large servings of all fresh fruits. Meat and Protein Substitutes  Allowed: Ground or well-cooked tender beef, ham, veal, lamb, pork, poultry. Eggs, plain cheese. Fish, oysters, shrimp, lobster, other seafood. Liver, organ meats. Smooth nut butters.  Avoid: Tough, fibrous meats with gristle. Chunky nut butter.Cheese with seeds, nuts, or other  foods not allowed. Nuts, seeds, legumes, dried peas, beans, lentils. Dairy  Allowed: All milk products except those not  allowed.  Avoid: Yogurt or cheese that contains nuts, seeds, or added fruit. Soups and Combination Foods  Allowed: Bouillon, broth, or cream soups made from allowed foods. Any strained soup. Casseroles or mixed dishes made with allowed foods.  Avoid: Soups made from vegetables that are not allowed or that contain other foods not allowed. Desserts and Sweets  Allowed:Plain cakes and cookies, pie made with allowed fruit, pudding, custard, cream pie. Gelatin, fruit, ice, sherbet, frozen ice pops. Ice cream, ice milk without nuts. Plain hard candy, honey, jelly, molasses, syrup, sugar, chocolate syrup, gumdrops, marshmallows.  Avoid: Desserts, cookies, or candies that contain nuts, peanut butter, dried fruits. Jams, preserves with seeds, marmalade. Fats and Oils  Allowed:Margarine, butter, cream, mayonnaise, salad oils, plain salad dressings made from allowed foods.  Avoid: Seeds, nuts, olives. Beverages  Allowed: All, except those listed to avoid.  Avoid: Fruit juices with high pulp, prune juice. Condiments  Allowed:Ketchup, mustard, horseradish, vinegar, cream sauce, cheese sauce, cocoa powder. Spices in moderation: allspice, basil, bay leaves, celery powder or leaves, cinnamon, cumin powder, curry powder, ginger, mace, marjoram, onion or garlic powder, oregano, paprika, parsley flakes, ground pepper, rosemary, sage, savory, tarragon, thyme, turmeric.  Avoid: Coconut, pickles. SAMPLE MENU Breakfast   cup orange juice.  1 boiled egg.  1 slice white toast.  Margarine.   cup cornflakes.  1 cup milk.  Beverage. Lunch   cup chicken noodle soup.  2 to 3 oz sliced roast beef.  2 slices white bread.  Mayonnaise.   cup tomato juice.  1 small banana.  Beverage. Dinner  3 oz baked chicken.   cup scalloped potatoes.   cup cooked  beets.  White dinner roll.  Margarine.   cup canned peaches.  Beverage. Document Released: 09/12/2001 Document Revised: 06/15/2011 Document Reviewed: 04/09/2011 [facility] Patient Information 2013 [place].   CC: Dr Florina Ou, Dr Jackolyn Confer

## 2012-05-10 ENCOUNTER — Ambulatory Visit (HOSPITAL_COMMUNITY)
Admission: RE | Admit: 2012-05-10 | Discharge: 2012-05-10 | Disposition: A | Discharge status: Home / Self Care | Payer: Medicare Other | Source: Ambulatory Visit | Attending: Internal Medicine | Admitting: Internal Medicine

## 2012-05-10 DIAGNOSIS — K5730 Diverticulosis of large intestine without perforation or abscess without bleeding: Secondary | ICD-10-CM | POA: Insufficient documentation

## 2012-05-10 DIAGNOSIS — K5792 Diverticulitis of intestine, part unspecified, without perforation or abscess without bleeding: Secondary | ICD-10-CM

## 2012-05-10 DIAGNOSIS — K5090 Crohn's disease, unspecified, without complications: Secondary | ICD-10-CM | POA: Insufficient documentation

## 2012-05-10 DIAGNOSIS — R1032 Left lower quadrant pain: Secondary | ICD-10-CM

## 2012-05-26 ENCOUNTER — Other Ambulatory Visit (INDEPENDENT_AMBULATORY_CARE_PROVIDER_SITE_OTHER): Payer: Self-pay | Admitting: General Surgery

## 2012-05-26 ENCOUNTER — Encounter (INDEPENDENT_AMBULATORY_CARE_PROVIDER_SITE_OTHER): Payer: Self-pay | Admitting: General Surgery

## 2012-05-26 ENCOUNTER — Ambulatory Visit (INDEPENDENT_AMBULATORY_CARE_PROVIDER_SITE_OTHER): Payer: Medicare Other | Admitting: General Surgery

## 2012-05-26 ENCOUNTER — Encounter (HOSPITAL_COMMUNITY): Payer: Self-pay | Admitting: Pharmacy Technician

## 2012-05-26 VITALS — BP 138/82 | HR 74 | Resp 18 | Ht 73.0 in | Wt 220.0 lb

## 2012-05-26 DIAGNOSIS — K5732 Diverticulitis of large intestine without perforation or abscess without bleeding: Secondary | ICD-10-CM

## 2012-05-26 MED ORDER — AMOXICILLIN-POT CLAVULANATE 875-125 MG PO TABS: 1.0000 | ORAL_TABLET | Freq: Every day | ORAL | Status: DC

## 2012-05-26 NOTE — Patient Instructions (Signed)
[facility] [place] [facility]  ONE-DAY (1) PRE-OP HOME COLON PREP INSTRUCTIONS: ** MIRALAX / GATORADE PREP ** Continue taking the Augmentin.   Fill the two prescriptions at a pharmacy of your choice.  You must follow the instructions below carefully.  If you have questions or problems, please call and speak to someone in the clinic department at our office:   [phone].  MIRALAX - GATORADE -- DULCOLAX TABS:   Fill the prescriptions for MIRALAX  (255 gm bottle)    In addition, purchase four (4) DULCOLAX TABLETS (no prescription required), and one 64 oz GATORADE.  (Do NOT purchase red Gatorade; any other flavor is acceptable).  INSTRUCTIONS: 1. Five days prior to your procedure do not eat nuts, popcorn, or fruit with seeds.  Stop all fiber supplements such as Metamucil, Citrucel, etc.  2. The day before your procedure: o 6:00am:  take (4) Dulcolax tablets.  You should remain on clear liquids for the entire day.   CLEAR LIQUIDS: clear bouillon, broth, jello (NOT RED), black coffee, tea, soda, etc o 10:00am:  add the bottle of MiraLax to the 64-oz bottle of Gatorade, and dissolve.  Begin drinking the Gatorade mixture until gone (8 oz every 15-30 minutes).  Continue clear liquids until midnight (or bedtime).   3. The day of your procedure:   Do not eat or drink ANYTHING after midnight before your surgery.     If you take Heart or Blood Pressure medicine, ask the pre-op nurses about these during your preop appointment.   Further pre-operative instructions will be given to you from the hospital.   Expect to be contacted 5-7 days before your surgery.

## 2012-05-26 NOTE — Progress Notes (Signed)
Patient ID: Micheal [name], male   DOB: 1955/12/24, 57 y.o.   MRN: [id number]  Chief Complaint  Patient presents with  . Other    Eval sigmoid colon     HPI Micheal [name] is a 57 y.o. male.   HPI  He was referred by Dr. Olevia Perches for further evaluation and treatment of recurrent sigmoid diverticulitis.  He has known sigmoid diverticulosis. He has had multiple bouts in the past. The last episode started around Thanksgiving and persisted until recently. He's been on Cipro and Flagyl and then was changed to Augmentin. He had a recent barium enema which demonstrated  Sigmoid diverticulosis as well as scattered diverticular disease throughout the remaining colon. Recent CT scan did not show active inflammatory changes. Colonoscopy last year demonstrated some narrowing of the sigmoid colon. He is feeling better since the barium enema in his stool caliber is improved. He is here to discuss possible surgery for his recurrent sigmoid diverticulitis. Of note is that he's had a ileocolectomy for Crohn's disease approximately 20 years ago.  CT scan also demonstrated 2 small inguinal hernias containing fat. He has had a ventral hernia repair before and his wife states that mesh was used. CT scan demonstrated a recurrent hernia containing fat at the superior aspect of the previous repair.  He is here with his wife  Past Medical History  Diagnosis Date  . Allergy   . GERD (gastroesophageal reflux disease)   . Crohn's   . Diverticulosis of colon (without mention of hemorrhage)   . Pelvic abscess   . Arthritis   . Tubular adenoma of colon 11/04/11    x 2    Past Surgical History  Procedure Laterality Date  . Popliteal synovial cyst excision      both kness  . Back surgery      L4/5  . Incisional hernia repair    . Rt. knee arthroscopy      x4  . Excisional hemorrhoidectomy    . Tonsillectomy and adenoidectomy    . Appendectomy    . Colon surgery       resection of terminal ileum/right colon 1993/crohns   . Hernia repair      lt. groin    Family History  Problem Relation Age of Onset  . Heart disease Father   . Cirrhosis Mother     alcohol induced  . Colon cancer Neg Hx     Social History History  Substance Use Topics  . Smoking status: Never Smoker   . Smokeless tobacco: Never Used  . Alcohol Use: No    Allergies  Allergen Reactions  . Codeine     REACTION: itching    Current Outpatient Prescriptions  Medication Sig Dispense Refill  . ALPRAZolam (XANAX) 0.5 MG tablet Take 0.5 mg by mouth at bedtime as needed.      Marland Kitchen amoxicillin-clavulanate (AUGMENTIN) 875-125 MG per tablet Take 1 tablet by mouth daily.  10 tablet  0  . oxyCODONE-acetaminophen (PERCOCET) 10-325 MG per tablet Take 1 tablet by mouth every 4 (four) hours as needed.        . pantoprazole (PROTONIX) 40 MG tablet Take 40 mg by mouth daily.       . Tamsulosin HCl (FLOMAX) 0.4 MG CAPS Take 0.4 mg by mouth at bedtime.         No current facility-administered medications for this visit.    Review of Systems Review of Systems  Constitutional: Negative.   Respiratory: Negative.  Cardiovascular: Negative.   Gastrointestinal: Positive for abdominal pain (llq). Negative for constipation.  Genitourinary: Positive for difficulty urinating (Takes Flomax).  Neurological: Negative.   Hematological: Negative.     Blood pressure 138/82, pulse 74, resp. rate 18, height 6' 1"  (1.854 m), weight 220 lb (99.791 kg).  Physical Exam Physical Exam  Constitutional: He appears well-developed and well-nourished. No distress.  HENT:  Head: Normocephalic and atraumatic.  Eyes: EOM are normal. No scleral icterus.  Neck: Neck supple.  Cardiovascular: Normal rate and regular rhythm.   Pulmonary/Chest: Effort normal and breath sounds normal.  Abdominal: Soft. He exhibits no distension and no mass. There is tenderness (Mild in LLQ).  Midline scar with small supraumbilical reducible bulge.  Genitourinary:  Right groin scar. No  palpable inguinal hernia on the right side. No palpable inguinal hernia on the left side.  Musculoskeletal: He exhibits no edema.  Lymphadenopathy:    He has no cervical adenopathy.  Neurological: He is alert.  Skin: Skin is warm and dry.  Psychiatric: He has a normal mood and affect. His behavior is normal.    Data Reviewed CT scan, melena, notes from Dr. Olevia Perches.  Assessment    Recurring sigmoid diverticulitis.  Currently feeling better after barium enema and on Augmentin. He is interested in surgery. He's had a previous abdominal operation and a ventral hernia repair with mesh.     Plan    Laparoscopic possible open sigmoid colectomy. I have advised him to continue his Augmentin intake probiotics. I told him the operation can be more difficult given the fact that he's had previous abdominal surgeries.  I have explained the procedure and risks of colon resection.  Risks include but are not limited to bleeding, infection, wound problems, anesthesia, anastomotic leak, need for colostomy, injury to intraabominal organs (such as intestine, spleen, kidney, bladder, ureter, etc.), ileus, irregular bowel habits.  He seems to understand and agrees to proceed.        Verdine Grenfell J 05/26/2012, 9:53 AM

## 2012-05-27 NOTE — Patient Instructions (Signed)
Micheal [name]  05/27/2012   Your procedure is scheduled on:  06/09/12   Report to [facility] at    1045  AM.  Call this number if you have problems the morning of surgery: [phone]   Remember:   Do not eat food or drink liquids after midnight.   Take these medicines the morning of surgery with A SIP OF WATER:    Do not wear jewelry,   Do not wear lotions, powders, or perfumes.    Men may shave face and neck.  Do not bring valuables to the hospital.  Contacts, dentures or bridgework may not be worn into surgery.  Leave suitcase in the car. After surgery it may be brought to your room.  For patients admitted to the hospital, checkout time is 11:00 AM the day of  discharge.       SEE CHG INSTRUCTION SHEET    Please read over the following fact sheets that you were given: MRSA Information, coughing and deep breathing exercises, leg exercises, Blood Transfusion Fact Sheet                Failure to comply with these instructions may result in cancellation of your surgery.                Patient Signature ____________________________              Nurse Signature _____________________________

## 2012-05-30 ENCOUNTER — Encounter (HOSPITAL_COMMUNITY): Payer: Self-pay

## 2012-05-30 ENCOUNTER — Encounter (HOSPITAL_COMMUNITY)
Admission: RE | Admit: 2012-05-30 | Discharge: 2012-05-30 | Disposition: A | Discharge status: Home / Self Care | Payer: Medicare Other | Source: Ambulatory Visit | Attending: General Surgery | Admitting: General Surgery

## 2012-05-30 HISTORY — DX: Anxiety disorder, unspecified: F41.9

## 2012-05-30 LAB — CBC WITH DIFFERENTIAL/PLATELET
Basophils Absolute: 0.1 10*3/uL (ref 0.0–0.1)
Basophils Relative: 1 % (ref 0–1)
HCT: 45.2 % (ref 39.0–52.0)
MCHC: 34.5 g/dL (ref 30.0–36.0)
Monocytes Absolute: 0.7 10*3/uL (ref 0.1–1.0)
Neutro Abs: 4.8 10*3/uL (ref 1.7–7.7)
Platelets: 212 10*3/uL (ref 150–400)
RDW: 13.2 % (ref 11.5–15.5)
WBC: 9.5 10*3/uL (ref 4.0–10.5)

## 2012-05-30 LAB — SURGICAL PCR SCREEN: MRSA, PCR: NEGATIVE

## 2012-05-30 LAB — COMPREHENSIVE METABOLIC PANEL
ALT: 37 U/L (ref 0–53)
AST: 28 U/L (ref 0–37)
Albumin: 4.1 g/dL (ref 3.5–5.2)
Calcium: 9.5 mg/dL (ref 8.4–10.5)
Chloride: 100 mEq/L (ref 96–112)
Creatinine, Ser: 0.81 mg/dL (ref 0.50–1.35)
Sodium: 139 mEq/L (ref 135–145)
Total Bilirubin: 0.3 mg/dL (ref 0.3–1.2)

## 2012-05-30 LAB — PROTIME-INR: INR: 0.93 (ref 0.00–1.49)

## 2012-05-30 NOTE — Progress Notes (Signed)
Your patient has screened at an elevated risk for Obstructive Sleep Apnea using the STOP-Bang Tool during a presurgical visit.  A score of 4 or greater is considered an elevated risk.

## 2012-05-30 NOTE — Progress Notes (Signed)
Patient stated he was told by anesthesia at last surgery he needed to have sleep study done.  Patient states he has never had sleep study done.

## 2012-06-09 ENCOUNTER — Encounter (HOSPITAL_COMMUNITY)
Admission: RE | Disposition: A | Discharge status: Home / Self Care | Payer: Self-pay | Source: Ambulatory Visit | Attending: General Surgery

## 2012-06-09 ENCOUNTER — Inpatient Hospital Stay (HOSPITAL_COMMUNITY): Payer: Medicare Other | Admitting: Anesthesiology

## 2012-06-09 ENCOUNTER — Encounter (HOSPITAL_COMMUNITY): Payer: Self-pay | Admitting: Anesthesiology

## 2012-06-09 ENCOUNTER — Inpatient Hospital Stay (HOSPITAL_COMMUNITY)
Admission: RE | Admit: 2012-06-09 | Discharge: 2012-06-13 | DRG: 331 | Disposition: A | Discharge status: Home / Self Care | Payer: Medicare Other | Source: Ambulatory Visit | Attending: General Surgery | Admitting: General Surgery

## 2012-06-09 ENCOUNTER — Encounter (HOSPITAL_COMMUNITY): Payer: Self-pay | Admitting: *Deleted

## 2012-06-09 DIAGNOSIS — G8929 Other chronic pain: Secondary | ICD-10-CM | POA: Diagnosis present

## 2012-06-09 DIAGNOSIS — K5732 Diverticulitis of large intestine without perforation or abscess without bleeding: Secondary | ICD-10-CM

## 2012-06-09 DIAGNOSIS — K5730 Diverticulosis of large intestine without perforation or abscess without bleeding: Secondary | ICD-10-CM

## 2012-06-09 DIAGNOSIS — Z01812 Encounter for preprocedural laboratory examination: Secondary | ICD-10-CM

## 2012-06-09 DIAGNOSIS — M549 Dorsalgia, unspecified: Secondary | ICD-10-CM | POA: Diagnosis present

## 2012-06-09 DIAGNOSIS — N400 Enlarged prostate without lower urinary tract symptoms: Secondary | ICD-10-CM | POA: Diagnosis present

## 2012-06-09 DIAGNOSIS — K219 Gastro-esophageal reflux disease without esophagitis: Secondary | ICD-10-CM | POA: Diagnosis present

## 2012-06-09 HISTORY — PX: LAPAROSCOPIC PARTIAL COLECTOMY: SHX5907

## 2012-06-09 LAB — TYPE AND SCREEN
ABO/RH(D): O NEG
Antibody Screen: NEGATIVE

## 2012-06-09 SURGERY — LAPAROSCOPIC PARTIAL COLECTOMY
Anesthesia: General | Site: Abdomen | Wound class: Clean Contaminated

## 2012-06-09 MED ORDER — HYDROMORPHONE HCL PF 1 MG/ML IJ SOLN
INTRAMUSCULAR | Status: DC | PRN
  Administered 2012-06-09 (×2): 1 mg via INTRAVENOUS

## 2012-06-09 MED ORDER — NEOSTIGMINE METHYLSULFATE 1 MG/ML IJ SOLN
INTRAMUSCULAR | Status: DC | PRN
  Administered 2012-06-09: 5 mg via INTRAVENOUS

## 2012-06-09 MED ORDER — ONDANSETRON HCL 4 MG/2ML IJ SOLN
4.0000 mg | INTRAMUSCULAR | Status: DC | PRN
  Administered 2012-06-10: 4 mg via INTRAVENOUS

## 2012-06-09 MED ORDER — TAMSULOSIN HCL 0.4 MG PO CAPS
0.4000 mg | ORAL_CAPSULE | Freq: Every day | ORAL | Status: DC
  Administered 2012-06-09 – 2012-06-12 (×4): 0.4 mg via ORAL
  Filled 2012-06-09 (×5): qty 1

## 2012-06-09 MED ORDER — FENTANYL CITRATE 0.05 MG/ML IJ SOLN
INTRAMUSCULAR | Status: DC | PRN
  Administered 2012-06-09 (×10): 50 ug via INTRAVENOUS

## 2012-06-09 MED ORDER — CEFOXITIN SODIUM 2 G IV SOLR
2.0000 g | INTRAVENOUS | Status: AC
  Administered 2012-06-09: 2 g via INTRAVENOUS
  Filled 2012-06-09: qty 2

## 2012-06-09 MED ORDER — HYDROMORPHONE HCL PF 1 MG/ML IJ SOLN
0.2500 mg | INTRAMUSCULAR | Status: DC | PRN
  Administered 2012-06-09: 0.5 mg via INTRAVENOUS

## 2012-06-09 MED ORDER — 0.9 % SODIUM CHLORIDE (POUR BTL) OPTIME
TOPICAL | Status: DC | PRN
  Administered 2012-06-09: 2000 mL

## 2012-06-09 MED ORDER — DEXAMETHASONE SODIUM PHOSPHATE 4 MG/ML IJ SOLN
INTRAMUSCULAR | Status: DC | PRN
  Administered 2012-06-09: 10 mg via INTRAVENOUS

## 2012-06-09 MED ORDER — DIPHENHYDRAMINE HCL 12.5 MG/5ML PO ELIX: 12.5000 mg | ORAL_SOLUTION | Freq: Four times a day (QID) | ORAL | Status: DC | PRN

## 2012-06-09 MED ORDER — LIDOCAINE HCL (CARDIAC) 20 MG/ML IV SOLN
INTRAVENOUS | Status: DC | PRN
  Administered 2012-06-09: 30 mg via INTRAVENOUS

## 2012-06-09 MED ORDER — ONDANSETRON HCL 4 MG/2ML IJ SOLN
INTRAMUSCULAR | Status: DC | PRN
  Administered 2012-06-09 (×2): 2 mg via INTRAVENOUS

## 2012-06-09 MED ORDER — PROPOFOL 10 MG/ML IV EMUL
INTRAVENOUS | Status: DC | PRN
  Administered 2012-06-09: 250 mg via INTRAVENOUS
  Administered 2012-06-09: 50 mg via INTRAVENOUS

## 2012-06-09 MED ORDER — LACTATED RINGERS IV SOLN: INTRAVENOUS | Status: DC

## 2012-06-09 MED ORDER — CISATRACURIUM BESYLATE (PF) 10 MG/5ML IV SOLN
INTRAVENOUS | Status: DC | PRN
  Administered 2012-06-09: 4 mg via INTRAVENOUS
  Administered 2012-06-09: 2 mg via INTRAVENOUS
  Administered 2012-06-09: 10 mg via INTRAVENOUS
  Administered 2012-06-09 (×2): 2 mg via INTRAVENOUS
  Administered 2012-06-09: 1 mg via INTRAVENOUS

## 2012-06-09 MED ORDER — KETAMINE HCL 10 MG/ML IJ SOLN
INTRAMUSCULAR | Status: DC | PRN
  Administered 2012-06-09 (×4): 5 mg via INTRAVENOUS

## 2012-06-09 MED ORDER — ONDANSETRON HCL 4 MG PO TABS: 4.0000 mg | ORAL_TABLET | Freq: Four times a day (QID) | ORAL | Status: DC | PRN

## 2012-06-09 MED ORDER — ALVIMOPAN 12 MG PO CAPS
12.0000 mg | ORAL_CAPSULE | Freq: Once | ORAL | Status: AC
  Administered 2012-06-09: 12 mg via ORAL
  Filled 2012-06-09: qty 1

## 2012-06-09 MED ORDER — HEPARIN SODIUM (PORCINE) 5000 UNIT/ML IJ SOLN
5000.0000 [IU] | Freq: Three times a day (TID) | INTRAMUSCULAR | Status: DC
  Administered 2012-06-10 – 2012-06-13 (×9): 5000 [IU] via SUBCUTANEOUS
  Filled 2012-06-09 (×12): qty 1

## 2012-06-09 MED ORDER — LACTATED RINGERS IV SOLN
INTRAVENOUS | Status: DC
  Administered 2012-06-09: 1000 mL via INTRAVENOUS

## 2012-06-09 MED ORDER — SUCCINYLCHOLINE CHLORIDE 20 MG/ML IJ SOLN
INTRAMUSCULAR | Status: DC | PRN
  Administered 2012-06-09: 150 mg via INTRAVENOUS

## 2012-06-09 MED ORDER — ALVIMOPAN 12 MG PO CAPS
12.0000 mg | ORAL_CAPSULE | Freq: Two times a day (BID) | ORAL | Status: DC
  Administered 2012-06-10 – 2012-06-11 (×4): 12 mg via ORAL
  Filled 2012-06-09 (×6): qty 1

## 2012-06-09 MED ORDER — ACETAMINOPHEN 10 MG/ML IV SOLN
INTRAVENOUS | Status: DC | PRN
  Administered 2012-06-09: 1000 mg via INTRAVENOUS

## 2012-06-09 MED ORDER — GLYCOPYRROLATE 0.2 MG/ML IJ SOLN
INTRAMUSCULAR | Status: DC | PRN
  Administered 2012-06-09: 0.6 mg via INTRAVENOUS

## 2012-06-09 MED ORDER — LACTATED RINGERS IV SOLN
INTRAVENOUS | Status: DC | PRN
  Administered 2012-06-09 (×2): via INTRAVENOUS

## 2012-06-09 MED ORDER — NALOXONE HCL 0.4 MG/ML IJ SOLN: 0.4000 mg | INTRAMUSCULAR | Status: DC | PRN

## 2012-06-09 MED ORDER — DIPHENHYDRAMINE HCL 50 MG/ML IJ SOLN: 12.5000 mg | Freq: Four times a day (QID) | INTRAMUSCULAR | Status: DC | PRN

## 2012-06-09 MED ORDER — ONDANSETRON HCL 4 MG/2ML IJ SOLN
4.0000 mg | Freq: Four times a day (QID) | INTRAMUSCULAR | Status: DC | PRN
  Filled 2012-06-09: qty 2

## 2012-06-09 MED ORDER — MIDAZOLAM HCL 5 MG/5ML IJ SOLN
INTRAMUSCULAR | Status: DC | PRN
  Administered 2012-06-09 (×2): 1 mg via INTRAVENOUS

## 2012-06-09 MED ORDER — PANTOPRAZOLE SODIUM 40 MG IV SOLR
40.0000 mg | INTRAVENOUS | Status: DC
  Administered 2012-06-09 – 2012-06-10 (×2): 40 mg via INTRAVENOUS
  Filled 2012-06-09 (×3): qty 40

## 2012-06-09 MED ORDER — ALPRAZOLAM 0.5 MG PO TABS
0.5000 mg | ORAL_TABLET | Freq: Every evening | ORAL | Status: DC | PRN
  Administered 2012-06-10 – 2012-06-11 (×2): 0.5 mg via ORAL
  Filled 2012-06-09 (×2): qty 1

## 2012-06-09 MED ORDER — DEXTROSE 5 % IV SOLN
2.0000 g | Freq: Two times a day (BID) | INTRAVENOUS | Status: AC
  Administered 2012-06-09: 2 g via INTRAVENOUS
  Filled 2012-06-09: qty 2

## 2012-06-09 MED ORDER — MORPHINE SULFATE (PF) 1 MG/ML IV SOLN
INTRAVENOUS | Status: DC
  Administered 2012-06-09: 1 mg via INTRAVENOUS
  Administered 2012-06-09: 12 mg via INTRAVENOUS
  Administered 2012-06-09 – 2012-06-10 (×2): via INTRAVENOUS
  Administered 2012-06-10: 1.5 mg via INTRAVENOUS
  Administered 2012-06-10: 20.8 mg via INTRAVENOUS
  Administered 2012-06-10: 10.36 mg via INTRAVENOUS
  Administered 2012-06-10: 10.5 mg via INTRAVENOUS
  Administered 2012-06-10: 06:00:00 via INTRAVENOUS
  Administered 2012-06-10: 9 mg via INTRAVENOUS
  Administered 2012-06-11: 4.5 mg via INTRAVENOUS
  Administered 2012-06-11: 7.47 mg via INTRAVENOUS
  Administered 2012-06-11: 6 mg via INTRAVENOUS
  Administered 2012-06-11: 4.5 mg via INTRAVENOUS
  Administered 2012-06-11: 9 mg via INTRAVENOUS
  Administered 2012-06-12: 6 mg via INTRAVENOUS
  Administered 2012-06-12 (×3): 1.5 mg via INTRAVENOUS
  Filled 2012-06-09 (×4): qty 25

## 2012-06-09 MED ORDER — LACTATED RINGERS IV SOLN
INTRAVENOUS | Status: DC | PRN
  Administered 2012-06-09: 1000 mL

## 2012-06-09 MED ORDER — SODIUM CHLORIDE 0.9 % IJ SOLN: 9.0000 mL | INTRAMUSCULAR | Status: DC | PRN

## 2012-06-09 MED ORDER — KCL-LACTATED RINGERS-D5W 20 MEQ/L IV SOLN
INTRAVENOUS | Status: DC
  Administered 2012-06-09 – 2012-06-11 (×4): via INTRAVENOUS
  Filled 2012-06-09 (×7): qty 1000

## 2012-06-09 MED ORDER — BUPIVACAINE HCL (PF) 0.5 % IJ SOLN
INTRAMUSCULAR | Status: DC | PRN
  Administered 2012-06-09: 10 mL

## 2012-06-09 SURGICAL SUPPLY — 73 items
APPLIER CLIP 5 13 M/L LIGAMAX5 (MISCELLANEOUS) ×2
APPLIER CLIP ROT 10 11.4 M/L (STAPLE)
BLADE EXTENDED COATED 6.5IN (ELECTRODE) ×2 IMPLANT
BLADE HEX COATED 2.75 (ELECTRODE) ×2 IMPLANT
BLADE SURG SZ10 CARB STEEL (BLADE) ×2 IMPLANT
CABLE HIGH FREQUENCY MONO STRZ (ELECTRODE) ×2 IMPLANT
CANISTER SUCTION 2500CC (MISCELLANEOUS) ×2 IMPLANT
CANNULA ENDOPATH XCEL 11M (ENDOMECHANICALS) IMPLANT
CELLS DAT CNTRL 66122 CELL SVR (MISCELLANEOUS) IMPLANT
CLIP APPLIE 5 13 M/L LIGAMAX5 (MISCELLANEOUS) ×1 IMPLANT
CLIP APPLIE ROT 10 11.4 M/L (STAPLE) IMPLANT
CLOTH BEACON ORANGE TIMEOUT ST (SAFETY) ×2 IMPLANT
COVER MAYO STAND STRL (DRAPES) ×4 IMPLANT
DECANTER SPIKE VIAL GLASS SM (MISCELLANEOUS) IMPLANT
DISSECTOR BLUNT TIP ENDO 5MM (MISCELLANEOUS) ×2 IMPLANT
DRAPE LAPAROSCOPIC ABDOMINAL (DRAPES) ×2 IMPLANT
DRAPE LG THREE QUARTER DISP (DRAPES) ×2 IMPLANT
DRAPE UTILITY XL STRL (DRAPES) ×4 IMPLANT
DRAPE WARM FLUID 44X44 (DRAPE) ×2 IMPLANT
ELECT REM PT RETURN 9FT ADLT (ELECTROSURGICAL) ×2
ELECTRODE REM PT RTRN 9FT ADLT (ELECTROSURGICAL) ×1 IMPLANT
EVACUATOR SILICONE 100CC (DRAIN) ×2 IMPLANT
FILTER SMOKE EVAC LAPAROSHD (FILTER) IMPLANT
GLOVE BIOGEL PI IND STRL 7.0 (GLOVE) ×1 IMPLANT
GLOVE BIOGEL PI INDICATOR 7.0 (GLOVE) ×1
GLOVE ECLIPSE 8.0 STRL XLNG CF (GLOVE) ×4 IMPLANT
GLOVE INDICATOR 8.0 STRL GRN (GLOVE) ×4 IMPLANT
GOWN STRL NON-REIN LRG LVL3 (GOWN DISPOSABLE) ×2 IMPLANT
GOWN STRL REIN XL XLG (GOWN DISPOSABLE) ×4 IMPLANT
KIT BASIN OR (CUSTOM PROCEDURE TRAY) ×2 IMPLANT
LEGGING LITHOTOMY PAIR STRL (DRAPES) ×2 IMPLANT
LIGASURE IMPACT 36 18CM CVD LR (INSTRUMENTS) ×2 IMPLANT
NS IRRIG 1000ML POUR BTL (IV SOLUTION) ×4 IMPLANT
PENCIL BUTTON HOLSTER BLD 10FT (ELECTRODE) ×4 IMPLANT
RELOAD PROXIMATE 75MM BLUE (ENDOMECHANICALS) ×4 IMPLANT
RTRCTR WOUND ALEXIS 18CM MED (MISCELLANEOUS)
SCALPEL HARMONIC ACE (MISCELLANEOUS) IMPLANT
SCISSORS LAP 5X35 DISP (ENDOMECHANICALS) IMPLANT
SET IRRIG TUBING LAPAROSCOPIC (IRRIGATION / IRRIGATOR) ×2 IMPLANT
SLEEVE XCEL OPT CAN 5 100 (ENDOMECHANICALS) ×8 IMPLANT
SOLUTION ANTI FOG 6CC (MISCELLANEOUS) ×2 IMPLANT
SPONGE GAUZE 4X4 12PLY (GAUZE/BANDAGES/DRESSINGS) IMPLANT
SPONGE LAP 18X18 X RAY DECT (DISPOSABLE) ×4 IMPLANT
STAPLER CIRC CVD 29MM 37CM (STAPLE) ×2 IMPLANT
STAPLER PROXIMATE 75MM BLUE (STAPLE) ×2 IMPLANT
STAPLER VISISTAT 35W (STAPLE) IMPLANT
SUCTION POOLE TIP (SUCTIONS) ×2 IMPLANT
SUT ETHILON 3 0 PS 1 (SUTURE) ×2 IMPLANT
SUT PDS AB 1 CTX 36 (SUTURE) IMPLANT
SUT PDS AB 1 TP1 96 (SUTURE) ×4 IMPLANT
SUT PROLENE 2 0 SH DA (SUTURE) ×2 IMPLANT
SUT SILK 2 0 (SUTURE) ×1
SUT SILK 2 0 SH CR/8 (SUTURE) ×2 IMPLANT
SUT SILK 2-0 18XBRD TIE 12 (SUTURE) ×1 IMPLANT
SUT SILK 3 0 (SUTURE) ×1
SUT SILK 3 0 SH CR/8 (SUTURE) ×4 IMPLANT
SUT SILK 3-0 18XBRD TIE 12 (SUTURE) ×1 IMPLANT
SUT VICRYL 2 0 18  UND BR (SUTURE) ×1
SUT VICRYL 2 0 18 UND BR (SUTURE) ×1 IMPLANT
SYS LAPSCP GELPORT 120MM (MISCELLANEOUS) ×2
SYSTEM LAPSCP GELPORT 120MM (MISCELLANEOUS) ×1 IMPLANT
TAPE CLOTH SURG 4X10 WHT LF (GAUZE/BANDAGES/DRESSINGS) ×2 IMPLANT
TOWEL OR 17X26 10 PK STRL BLUE (TOWEL DISPOSABLE) ×4 IMPLANT
TRAY FOLEY CATH 14FRSI W/METER (CATHETERS) ×2 IMPLANT
TRAY LAP CHOLE (CUSTOM PROCEDURE TRAY) ×2 IMPLANT
TROCAR BLADELESS OPT 5 100 (ENDOMECHANICALS) ×2 IMPLANT
TROCAR BLADELESS OPT 5 75 (ENDOMECHANICALS) ×4 IMPLANT
TROCAR SLEEVE XCEL 5X75 (ENDOMECHANICALS) ×2 IMPLANT
TROCAR XCEL BLUNT TIP 100MML (ENDOMECHANICALS) IMPLANT
TROCAR XCEL NON-BLD 11X100MML (ENDOMECHANICALS) IMPLANT
TUBING INSUFFLATION 10FT LAP (TUBING) ×2 IMPLANT
YANKAUER SUCT BULB TIP 10FT TU (MISCELLANEOUS) ×2 IMPLANT
YANKAUER SUCT BULB TIP NO VENT (SUCTIONS) ×2 IMPLANT

## 2012-06-09 NOTE — Transfer of Care (Signed)
Immediate Anesthesia Transfer of Care Note  Patient: Micheal [name]  Procedure(s) Performed: Procedure(s): LAPAROSCOPIC assisted PARTIAL COLECTOMY open  (N/A)  Patient Location: PACU  Anesthesia Type:General  Level of Consciousness: awake, alert , oriented and patient cooperative  Airway & Oxygen Therapy: Patient connected to face mask oxygen  Post-op Assessment: Report given to PACU RN and Post -op Vital signs reviewed and stable  Post vital signs: stable  Complications: No apparent anesthesia complications

## 2012-06-09 NOTE — Anesthesia Postprocedure Evaluation (Signed)
  Anesthesia Post-op Note  Patient: Micheal [name]  Procedure(s) Performed: Procedure(s) (LRB): LAPAROSCOPIC assisted PARTIAL COLECTOMY open  (N/A)  Patient Location: PACU  Anesthesia Type: General  Level of Consciousness: awake and alert   Airway and Oxygen Therapy: Patient Spontanous Breathing  Post-op Pain: mild  Post-op Assessment: Post-op Vital signs reviewed, Patient's Cardiovascular Status Stable, Respiratory Function Stable, Patent Airway and No signs of Nausea or vomiting  Last Vitals:  Filed Vitals:   06/09/12 1830  BP: 140/82  Pulse: 59  Temp: 37.1 C  Resp: 10    Post-op Vital Signs: stable   Complications: No apparent anesthesia complications

## 2012-06-09 NOTE — Op Note (Signed)
Operative Note  Micheal [name] male 57 y.o. 06/09/2012  PREOPERATIVE DX:  Recurrent sigmoid diverticulitis  POSTOPERATIVE DX:  Same  PROCEDURE:  Laparoscopic-assisted sigmoid colectomy         Surgeon: Odis Hollingshead   Assistants:   Donnie Mesa. M.D.  Anesthesia: General endotracheal anesthesia  Indications:   This is a 57 year old male whose had multiple bouts of sigmoid diverticulitis. He has diverticulosis and some narrowing and colonoscopy as well as barium enema. He now presents for elective laparoscopic-assisted sigmoid colectomy.    Procedure Detail:  He was brought to the operating room placed supine on the operating table and a general anesthetic was given. He was placed in the lithotomy position. A Foley catheter was inserted. A oral gastric tube was inserted. The hair in the abdominal wall was clipped. The abdominal wall and perineal areas were then sterilely prepped and draped.  Using a 5 mm Optiview trocar and laparoscope a small incision was made in the left upper quadrant and access was gained into the peritoneal cavity. A pneumoperitoneum was created. Visualization of the trocar area did not demonstrate any evidence of bleeding organ injury. There were adhesions between the omentum and the midline abdominal wall and  small bowel and midline abdominal wall.  An area of sigmoid colon adherent to the lateral body wall could be seen. A 5 mm trocar was then placed in the left lower quadrant. Sharp lysis of adhesions was then performed between the omentum and abdominal wall. A 5 mm trocar was in be placed in the suprapubic area and one in the right lower quadrant. More sharp lysis of adhesions was performed along the midline dissecting the omentum free from the abdominal wall. I did lyse some of the adhesions holding the small bowel to the abdominal wall. A second 5 mm trocar was placed in the right lower quadrant that allowed for more lysis of adhesions. I was able to clear out  the lower midline by doing this.  Next, I mobilized the sigmoid colon free from the lateral abdominal wall. The descending colon was then mobilized by dividing its lateral attachments. The rectosigmoid colon was very mobile. He had a significant amount of redundant sigmoid colon.  The 5 mm suprapubic trocar was removed. A small midline incision was made for the extraction site. The wound protection device was placed. The sigmoid colon was exteriorized. The inflamed and thickened area was noted. I chose an area of the descending colon there was proximal to the muscular hypertrophied area. It appeared to be normal. It was divided with the GIA stapler. The mesentery was divided close to the bowel with the LigaSure. Larger vessels were ligated with silk ties. I then divided the rectum just below the rectosigmoid junction with a GIA stapler. The rest of the mesentery was divided with the LigaSure. The proximal aspect of the sigmoid colon was marked and sent to pathology. The mesentery was inspected and there was no bleeding noted.  A side descending colon to end rectum anastomosis was then performed with a #29 EEA stapler. The staple line of the descending colon was removed and the anvil placed in the descending colon. The defect was then closed with the GIA stapler. The and goes and brought out the side of the colon and a pursestring suture of 2-0 Prolene was placed around it securing it. The handle of the EEA stapler was then passed through the anus and the anastomosis was performed. 2 intact donuts of colon were noted.  Air was insufflated through the rectum by way of a proctosigmoidoscope and a leak test was performed. No leak was noted. The anastomosis was patent, viable, and under no tension.  It down cavity was then copiously irrigated with saline solution. The left lower quadrant 5 mm trocar was removed a 19 Blake drain was placed through this incision. It was positioned in the pelvis. It was anchored to  the skin with 3-0 nylon suture.  No bleeding was noted at this time. The lower midline extraction site fascial incision was closed with running double looped #1 PDS suture. The subcutaneous tissue was irrigated.  Laparoscopy was performed. There were some bleeding from part of the omentum that was controlled with ligaclips and cautery. I added another 5 mm trocar to the right midabdomen to perform this. Irrigation was performed and hemostasis was adequate. A 4 quadrant inspection demonstrated no evidence of bleeding or organ injury.  The trocars were removed and pneumoperitoneum was released. The drain was hooked up to closed bulb suction.  Trocar sites skin incisions were closed with 4-0 Monocryl subcuticular stitches.  The low midline skin was closed with staples. Sterile dressings were applied.  He tolerated the procedure well without any apparent complications and was taken to the recovery room in satisfactory condition.  Estimated Blood Loss:  400 ml         Drains: JACKSON-PRATT (JP)  Blood Given: none          Specimens: Sigmoid colon        Complications:  * No complications entered in OR log *         Disposition: PACU - hemodynamically stable.         Condition: stable

## 2012-06-09 NOTE — Interval H&P Note (Signed)
History and Physical Interval Note:  06/09/2012 2:25 PM  Micheal [name]  has presented today for surgery, with the diagnosis of recurrent sigmoid diverticulitis   The various methods of treatment have been discussed with the patient and family. After consideration of risks, benefits and other options for treatment, the patient has consented to  Procedure(s): LAPAROSCOPIC PARTIAL COLECTOMY possible open  (N/A) as a surgical intervention .  The patient's history has been reviewed, patient examined, no change in status, stable for surgery.  I have reviewed the patient's chart and labs.  Questions were answered to the patient's satisfaction.     ROSENBOWER,TODD Lenna Sciara

## 2012-06-09 NOTE — H&P (View-Only) (Signed)
Patient ID: Micheal [name], male   DOB: [birth year], 57 y.o.   MRN: [id number]  Chief Complaint  Patient presents with  . Other    Eval sigmoid colon     HPI Micheal [name] is a 57 y.o. male.   HPI  He was referred by Dr. Olevia Perches for further evaluation and treatment of recurrent sigmoid diverticulitis.  He has known sigmoid diverticulosis. He has had multiple bouts in the past. The last episode started around Thanksgiving and persisted until recently. He's been on Cipro and Flagyl and then was changed to Augmentin. He had a recent barium enema which demonstrated  Sigmoid diverticulosis as well as scattered diverticular disease throughout the remaining colon. Recent CT scan did not show active inflammatory changes. Colonoscopy last year demonstrated some narrowing of the sigmoid colon. He is feeling better since the barium enema in his stool caliber is improved. He is here to discuss possible surgery for his recurrent sigmoid diverticulitis. Of note is that he's had a ileocolectomy for Crohn's disease approximately 20 years ago.  CT scan also demonstrated 2 small inguinal hernias containing fat. He has had a ventral hernia repair before and his wife states that mesh was used. CT scan demonstrated a recurrent hernia containing fat at the superior aspect of the previous repair.  He is here with his wife  Past Medical History  Diagnosis Date  . Allergy   . GERD (gastroesophageal reflux disease)   . Crohn's   . Diverticulosis of colon (without mention of hemorrhage)   . Pelvic abscess   . Arthritis   . Tubular adenoma of colon 11/04/11    x 2    Past Surgical History  Procedure Laterality Date  . Popliteal synovial cyst excision      both kness  . Back surgery      L4/5  . Incisional hernia repair    . Rt. knee arthroscopy      x4  . Excisional hemorrhoidectomy    . Tonsillectomy and adenoidectomy    . Appendectomy    . Colon surgery       resection of terminal ileum/right colon 1993/crohns   . Hernia repair      lt. groin    Family History  Problem Relation Age of Onset  . Heart disease Father   . Cirrhosis Mother     alcohol induced  . Colon cancer Neg Hx     Social History History  Substance Use Topics  . Smoking status: Never Smoker   . Smokeless tobacco: Never Used  . Alcohol Use: No    Allergies  Allergen Reactions  . Codeine     REACTION: itching    Current Outpatient Prescriptions  Medication Sig Dispense Refill  . ALPRAZolam (XANAX) 0.5 MG tablet Take 0.5 mg by mouth at bedtime as needed.      Marland Kitchen amoxicillin-clavulanate (AUGMENTIN) 875-125 MG per tablet Take 1 tablet by mouth daily.  10 tablet  0  . oxyCODONE-acetaminophen (PERCOCET) 10-325 MG per tablet Take 1 tablet by mouth every 4 (four) hours as needed.        . pantoprazole (PROTONIX) 40 MG tablet Take 40 mg by mouth daily.       . Tamsulosin HCl (FLOMAX) 0.4 MG CAPS Take 0.4 mg by mouth at bedtime.         No current facility-administered medications for this visit.    Review of Systems Review of Systems  Constitutional: Negative.   Respiratory: Negative.  Cardiovascular: Negative.   Gastrointestinal: Positive for abdominal pain (llq). Negative for constipation.  Genitourinary: Positive for difficulty urinating (Takes Flomax).  Neurological: Negative.   Hematological: Negative.     Blood pressure 138/82, pulse 74, resp. rate 18, height 6' 1"  (1.854 m), weight 220 lb (99.791 kg).  Physical Exam Physical Exam  Constitutional: He appears well-developed and well-nourished. No distress.  HENT:  Head: Normocephalic and atraumatic.  Eyes: EOM are normal. No scleral icterus.  Neck: Neck supple.  Cardiovascular: Normal rate and regular rhythm.   Pulmonary/Chest: Effort normal and breath sounds normal.  Abdominal: Soft. He exhibits no distension and no mass. There is tenderness (Mild in LLQ).  Midline scar with small supraumbilical reducible bulge.  Genitourinary:  Right groin scar. No  palpable inguinal hernia on the right side. No palpable inguinal hernia on the left side.  Musculoskeletal: He exhibits no edema.  Lymphadenopathy:    He has no cervical adenopathy.  Neurological: He is alert.  Skin: Skin is warm and dry.  Psychiatric: He has a normal mood and affect. His behavior is normal.    Data Reviewed CT scan, melena, notes from Dr. Olevia Perches.  Assessment    Recurring sigmoid diverticulitis.  Currently feeling better after barium enema and on Augmentin. He is interested in surgery. He's had a previous abdominal operation and a ventral hernia repair with mesh.     Plan    Laparoscopic possible open sigmoid colectomy. I have advised him to continue his Augmentin intake probiotics. I told him the operation can be more difficult given the fact that he's had previous abdominal surgeries.  I have explained the procedure and risks of colon resection.  Risks include but are not limited to bleeding, infection, wound problems, anesthesia, anastomotic leak, need for colostomy, injury to intraabominal organs (such as intestine, spleen, kidney, bladder, ureter, etc.), ileus, irregular bowel habits.  He seems to understand and agrees to proceed.        Nesta Kimple J 05/26/2012, 9:53 AM

## 2012-06-09 NOTE — Anesthesia Preprocedure Evaluation (Signed)
Anesthesia Evaluation  Patient identified by MRN, date of birth, ID band Patient awake    Reviewed: Allergy & Precautions, H&P , NPO status , Patient's Chart, lab work & pertinent test results  Airway Mallampati: II TM Distance: >3 FB Neck ROM: full    Dental no notable dental hx.    Pulmonary neg pulmonary ROS,  breath sounds clear to auscultation  Pulmonary exam normal       Cardiovascular Exercise Tolerance: Good negative cardio ROS  Rhythm:regular Rate:Normal     Neuro/Psych negative neurological ROS  negative psych ROS   GI/Hepatic negative GI ROS, Neg liver ROS, GERD-  Medicated and Controlled,  Endo/Other  negative endocrine ROS  Renal/GU negative Renal ROS  negative genitourinary   Musculoskeletal   Abdominal   Peds  Hematology negative hematology ROS (+)   Anesthesia Other Findings   Reproductive/Obstetrics negative OB ROS                           Anesthesia Physical Anesthesia Plan  ASA: II  Anesthesia Plan: General   Post-op Pain Management:    Induction: Intravenous  Airway Management Planned: Oral ETT  Additional Equipment:   Intra-op Plan:   Post-operative Plan: Extubation in OR  Informed Consent: I have reviewed the patients History and Physical, chart, labs and discussed the procedure including the risks, benefits and alternatives for the proposed anesthesia with the patient or authorized representative who has indicated his/her understanding and acceptance.   Dental Advisory Given  Plan Discussed with: CRNA and Surgeon  Anesthesia Plan Comments:         Anesthesia Quick Evaluation

## 2012-06-10 LAB — BASIC METABOLIC PANEL
CO2: 25 mEq/L (ref 19–32)
GFR calc non Af Amer: >90 mL/min (ref >90)
Glucose, Bld: 165 mg/dL — ABNORMAL HIGH (ref 70–99)
Potassium: 4 mEq/L (ref 3.5–5.1)
Sodium: 138 mEq/L (ref 135–145)

## 2012-06-10 LAB — CBC
Hemoglobin: 13.6 g/dL (ref 13.0–17.0)
MCH: 30 pg (ref 26.0–34.0)
MCV: 85.9 fL (ref 78.0–100.0)
RBC: 4.53 MIL/uL (ref 4.22–5.81)
WBC: 15.1 10*3/uL — ABNORMAL HIGH (ref 4.0–10.5)

## 2012-06-10 LAB — GLUCOSE, CAPILLARY: Glucose-Capillary: 124 mg/dL — ABNORMAL HIGH (ref 70–99)

## 2012-06-10 NOTE — Progress Notes (Signed)
1 Day Post-Op  Subjective: Pain well-controlled.  No nausea.  Has walked.  Objective: Vital signs in last 24 hours: Temp:  [97.3 F (36.3 C)-98.9 F (37.2 C)] 98.9 F (37.2 C) (03/07 0533) Pulse Rate:  [58-85] 75 (03/07 0533) Resp:  [10-20] 11 (03/07 0533) BP: (118-155)/(77-96) 118/77 mmHg (03/07 0533) SpO2:  [96 %-100 %] 97 % (03/07 0533) Weight:  [220 lb (99.791 kg)] 220 lb (99.791 kg) (03/06 1856) Last BM Date: 06/08/12  Intake/Output from previous day: 03/06 0701 - 03/07 0700 In: 2268.8 [I.V.:2268.8] Out: 925 [Urine:550; Drains:155; Blood:200] Intake/Output this shift:    PE: General- In NAD Abdomen-soft, hypoactive bowel sounds, dressings dry, serosanguinous drain output  Lab Results:   Recent Labs  06/10/12 0420  WBC 15.1*  HGB 13.6  HCT 38.9*  PLT 212   BMET  Recent Labs  06/10/12 0420  NA 138  K 4.0  CL 102  CO2 25  GLUCOSE 165*  BUN 14  CREATININE 0.75  CALCIUM 9.0   PT/INR No results found for this basename: LABPROT, INR,  in the last 72 hours Comprehensive Metabolic Panel:    Component Value Date/Time   NA 138 06/10/2012 0420   K 4.0 06/10/2012 0420   CL 102 06/10/2012 0420   CO2 25 06/10/2012 0420   BUN 14 06/10/2012 0420   CREATININE 0.75 06/10/2012 0420   GLUCOSE 165* 06/10/2012 0420   CALCIUM 9.0 06/10/2012 0420   AST 28 05/30/2012 0815   ALT 37 05/30/2012 0815   ALKPHOS 59 05/30/2012 0815   BILITOT 0.3 05/30/2012 0815   PROT 7.1 05/30/2012 0815   ALBUMIN 4.1 05/30/2012 0815     Studies/Results: No results found.  Anti-infectives: Anti-infectives   Start     Dose/Rate Route Frequency Ordered Stop   06/09/12 2200  cefoTEtan (CEFOTAN) 2 g in dextrose 5 % 50 mL IVPB     2 g 100 mL/hr over 30 Minutes Intravenous Every 12 hours 06/09/12 1916 06/09/12 2156   06/09/12 1039  cefOXitin (MEFOXIN) 2 g in dextrose 5 % 50 mL IVPB     2 g 100 mL/hr over 30 Minutes Intravenous On call to O.R. 06/09/12 1040 06/09/12 1500      Assessment Principal  Problem:   Sigmoid diverticulitis-recurrent s/p laparoscopic sigmoid colectomy 06/09/12-stable overnight. Active Problems:   BPH (benign prostatic hypertrophy)-on Flomax   Chronic back pain    LOS: 1 day   Plan: Clear liquid diet.   ROSENBOWER,TODD J 06/10/2012

## 2012-06-10 NOTE — Progress Notes (Signed)
Utilization review completed.  

## 2012-06-11 MED ORDER — LIP MEDEX EX OINT
1.0000 "application " | TOPICAL_OINTMENT | Freq: Two times a day (BID) | CUTANEOUS | Status: DC
  Administered 2012-06-11 – 2012-06-12 (×4): 1 via TOPICAL

## 2012-06-11 MED ORDER — PANTOPRAZOLE SODIUM 40 MG PO TBEC
40.0000 mg | DELAYED_RELEASE_TABLET | Freq: Every day | ORAL | Status: DC
  Administered 2012-06-11 – 2012-06-13 (×3): 40 mg via ORAL
  Filled 2012-06-11 (×3): qty 1

## 2012-06-11 MED ORDER — METOPROLOL TARTRATE 12.5 MG HALF TABLET
12.5000 mg | ORAL_TABLET | Freq: Two times a day (BID) | ORAL | Status: DC | PRN
  Filled 2012-06-11: qty 1

## 2012-06-11 MED ORDER — SACCHAROMYCES BOULARDII 250 MG PO CAPS
250.0000 mg | ORAL_CAPSULE | Freq: Two times a day (BID) | ORAL | Status: DC
  Administered 2012-06-11 – 2012-06-13 (×5): 250 mg via ORAL
  Filled 2012-06-11 (×6): qty 1

## 2012-06-11 MED ORDER — MAGIC MOUTHWASH
15.0000 mL | Freq: Four times a day (QID) | ORAL | Status: DC | PRN
  Filled 2012-06-11: qty 15

## 2012-06-11 MED ORDER — ACETAMINOPHEN 500 MG PO TABS
1000.0000 mg | ORAL_TABLET | Freq: Three times a day (TID) | ORAL | Status: DC
  Administered 2012-06-11 – 2012-06-12 (×6): 1000 mg via ORAL
  Filled 2012-06-11 (×9): qty 2

## 2012-06-11 MED ORDER — PROMETHAZINE HCL 25 MG/ML IJ SOLN: 12.5000 mg | Freq: Four times a day (QID) | INTRAMUSCULAR | Status: DC | PRN

## 2012-06-11 MED ORDER — ALUM & MAG HYDROXIDE-SIMETH 200-200-20 MG/5ML PO SUSP: 30.0000 mL | Freq: Four times a day (QID) | ORAL | Status: DC | PRN

## 2012-06-11 NOTE — Progress Notes (Signed)
Micheal [name] [id number] 10-28-1955   Subjective:  Walking in hallways No nausea/vomiting  Objective:  Vital signs:  Filed Vitals:   06/10/12 2352 06/11/12 0255 06/11/12 0528 06/11/12 0538  BP:  105/67 129/82   Pulse:  75 91   Temp:  99 F (37.2 C) 99 F (37.2 C)   TempSrc:  Oral Oral   Resp: 16 14 12 12   Height:      Weight:      SpO2: 97% 98% 96% 96%    Last BM Date: 06/08/12  Intake/Output   Yesterday:  03/07 0701 - 03/08 0700 In: 9449 [P.O.:580; I.V.:2760] Out: 2655 [Urine:2575; Drains:80] This shift:  Total I/O In: 1600 [P.O.:340; I.V.:1260] Out: 1900 [Urine:1875; Drains:25]  Bowel function:  Flatus: no  BM: no  Physical Exam:  General: Pt awake/alert/oriented x4 in no acute distress Eyes: PERRL, normal EOM.  Sclera clear.  No icterus Neuro: CN II-XII intact w/o focal sensory/motor deficits. Lymph: No head/neck/groin lymphadenopathy Psych:  No delerium/psychosis/paranoia HENT: Normocephalic, Mucus membranes moist.  No thrush Neck: Supple, No tracheal deviation Chest: No chest wall pain w good excursion CV:  Pulses intact.  Regular rhythm MS: Normal AROM mjr joints.  No obvious deformity Abdomen: Soft.  Nondistended.  Mildly tender at incisions only.  No incarcerated hernias. Ext:  SCDs BLE.  No mjr edema.  No cyanosis Skin: No petechiae / purpurae  Problem List:  Principal Problem:   Sigmoid diverticulitis-recurrent s/p laparoscopic sigmoid colectomy 06/09/12 Active Problems:   BPH (benign prostatic hypertrophy)   Chronic back pain   Assessment  Micheal [name]  57 y.o. male  2 Days Post-Op  Procedure(s): LAPAROSCOPIC assisted PARTIAL COLECTOMY open   Recovering  Plan:  -adv diet gradually -dec IVF -PPI for GERD -anxiolysis -VTE prophylaxis- SCDs, etc -mobilize as tolerated to help recovery  [name], M.D., F.A.C.S. Gastrointestinal and Minimally Invasive Surgery [facility] [address] [place], [place] [address] [phone] Main / Paging [phone] Voice Mail   06/11/2012  CARE TEAM:  PCP: Florina Ou, MD  Outpatient Care Team: Patient Care Team: Florina Ou, MD as PCP - General (Family Medicine)  Inpatient Treatment Team: Treatment Team: Attending Provider: Odis Hollingshead, MD; Registered Nurse: Sue Lush; Registered Nurse: Lelon Huh, RN; Registered Nurse: Peggye Fothergill, RN; Technician: Leandrew Koyanagi, NT   Results:   Labs: Results for orders placed during the hospital encounter of 06/09/12 (from the past 48 hour(s))  TYPE AND SCREEN     Status: None   Collection Time    06/09/12 10:55 AM      Result Value Range   ABO/RH(D) O NEG     Antibody Screen NEG     Sample Expiration 06/12/2012    ABO/RH     Status: None   Collection Time    06/09/12 11:00 AM      Result Value Range   ABO/RH(D) O NEG    BASIC METABOLIC PANEL     Status: Abnormal   Collection Time    06/10/12  4:20 AM      Result Value Range   Sodium 138  135 - 145 mEq/L   Potassium 4.0  3.5 - 5.1 mEq/L   Chloride 102  96 - 112 mEq/L   CO2 25  19 - 32 mEq/L   Glucose, Bld 165 (*) 70 - 99 mg/dL   BUN 14  6 - 23 mg/dL   Creatinine, Ser 0.75  0.50 - 1.35 mg/dL   Calcium 9.0  8.4 - 10.5 mg/dL   GFR calc non Af Amer >90  >90 mL/min   GFR calc Af Amer >90  >90 mL/min   Comment:            The eGFR has been calculated     using the CKD EPI equation.     This calculation has not been     validated in all clinical     situations.     eGFR's persistently     <90 mL/min signify     possible Chronic Kidney Disease.  CBC     Status: Abnormal   Collection Time    06/10/12  4:20 AM      Result Value Range   WBC 15.1 (*) 4.0 - 10.5 K/uL   RBC 4.53  4.22 - 5.81 MIL/uL   Hemoglobin 13.6  13.0 - 17.0 g/dL   HCT 38.9 (*) 39.0 - 52.0 %   MCV 85.9  78.0 - 100.0 fL   MCH 30.0  26.0 - 34.0 pg   MCHC 35.0  30.0 - 36.0 g/dL   RDW 13.3  11.5 - 15.5 %    Platelets 212  150 - 400 K/uL  GLUCOSE, CAPILLARY     Status: Abnormal   Collection Time    06/10/12 10:34 PM      Result Value Range   Glucose-Capillary 124 (*) 70 - 99 mg/dL    Imaging / Studies: No results found.  Medications / Allergies: per chart  Antibiotics: Anti-infectives   Start     Dose/Rate Route Frequency Ordered Stop   06/09/12 2200  cefoTEtan (CEFOTAN) 2 g in dextrose 5 % 50 mL IVPB     2 g 100 mL/hr over 30 Minutes Intravenous Every 12 hours 06/09/12 1916 06/09/12 2156   06/09/12 1039  cefOXitin (MEFOXIN) 2 g in dextrose 5 % 50 mL IVPB     2 g 100 mL/hr over 30 Minutes Intravenous On call to O.R. 06/09/12 1040 06/09/12 1500

## 2012-06-12 MED ORDER — ENSURE COMPLETE PO LIQD
237.0000 mL | Freq: Three times a day (TID) | ORAL | Status: DC
  Administered 2012-06-12 – 2012-06-13 (×3): 237 mL via ORAL

## 2012-06-12 MED ORDER — MORPHINE SULFATE 2 MG/ML IJ SOLN: 2.0000 mg | INTRAMUSCULAR | Status: DC | PRN

## 2012-06-12 MED ORDER — SODIUM CHLORIDE 0.9 % IJ SOLN
3.0000 mL | Freq: Two times a day (BID) | INTRAMUSCULAR | Status: DC
  Administered 2012-06-12: 3 mL via INTRAVENOUS

## 2012-06-12 MED ORDER — SODIUM CHLORIDE 0.9 % IJ SOLN: 3.0000 mL | INTRAMUSCULAR | Status: DC | PRN

## 2012-06-12 MED ORDER — LACTATED RINGERS IV BOLUS (SEPSIS): 1000.0000 mL | Freq: Three times a day (TID) | INTRAVENOUS | Status: DC | PRN

## 2012-06-12 MED ORDER — OXYCODONE HCL 5 MG PO TABS
5.0000 mg | ORAL_TABLET | ORAL | Status: DC | PRN
Start: 2012-06-12 — End: 2012-06-13
  Administered 2012-06-12 – 2012-06-13 (×5): 10 mg via ORAL
  Filled 2012-06-12 (×5): qty 2

## 2012-06-12 NOTE — Progress Notes (Signed)
Micheal [name] [id number] 03-Jun-1955   Subjective:  Walking in hallways No nausea/vomiting Feeling better Not using PCA much Family in room  Objective:  Vital signs:  Filed Vitals:   06/12/12 0421 06/12/12 0500 06/12/12 0730 06/12/12 0801  BP:  132/85 124/71   Pulse:  70 67   Temp:  98.5 F (36.9 C) 97.6 F (36.4 C)   TempSrc:  Oral Oral   Resp: 14 16 16 16   Height:      Weight:      SpO2: 99% 99% 100% 98%    Last BM Date: 06/11/12  Intake/Output   Yesterday:  03/08 0701 - 03/09 0700 In: 2860 [P.O.:560; I.V.:2300] Out: 1380 [Urine:1300; Drains:80] This shift:     Bowel function:  Flatus: Yes  BM: Small  Physical Exam:  General: Pt awake/alert/oriented x4 in no acute distress Eyes: PERRL, normal EOM.  Sclera clear.  No icterus Neuro: CN II-XII intact w/o focal sensory/motor deficits. Lymph: No head/neck/groin lymphadenopathy Psych:  No delerium/psychosis/paranoia HENT: Normocephalic, Mucus membranes moist.  No thrush Neck: Supple, No tracheal deviation Chest: No chest wall pain w good excursion CV:  Pulses intact.  Regular rhythm MS: Normal AROM mjr joints.  No obvious deformity Abdomen: Soft.  Nondistended.  Minimally tender at incisions only.  No incarcerated hernias. Ext:  SCDs BLE.  No mjr edema.  No cyanosis Skin: No petechiae / purpurae  Problem List:  Principal Problem:   Sigmoid diverticulitis-recurrent s/p laparoscopic sigmoid colectomy 06/09/12 Active Problems:   BPH (benign prostatic hypertrophy)   Chronic back pain   Assessment  Micheal [name]  57 y.o. male  3 Days Post-Op  Procedure(s): LAPAROSCOPIC assisted PARTIAL COLECTOMY open   Recovering  Plan:  -adv diet to solidsy -stop IVF -d/c PCA -PPI for GERD -anxiolysis -VTE prophylaxis- SCDs, etc -mobilize as tolerated to help recovery  D/C patient from hospital when patient meets criteria, prob tomorrow:  Tolerating oral intake well Ambulating in walkways Adequate pain  control without IV medications Urinating  Having flatus   Micheal [name], M.D., F.A.C.S. Gastrointestinal and Minimally Invasive Surgery [facility] [address], [place], [place] [address] [phone] Main / Paging [phone] Voice Mail   06/12/2012  CARE TEAM:  PCP: Florina Ou, MD  Outpatient Care Team: Patient Care Team: Florina Ou, MD as PCP - General (Family Medicine)  Inpatient Treatment Team: Treatment Team: Attending Provider: Odis Hollingshead, MD; Registered Nurse: Sue Lush; Registered Nurse: Lelon Huh, RN; Registered Nurse: Peggye Fothergill, RN; Technician: Leandrew Koyanagi, NT; Student Nurse: [place]; Registered Nurse: Sonda Rumble, RN   Results:   Labs: Results for orders placed during the hospital encounter of 06/09/12 (from the past 48 hour(s))  GLUCOSE, CAPILLARY     Status: Abnormal   Collection Time    06/10/12 10:34 PM      Result Value Range   Glucose-Capillary 124 (*) 70 - 99 mg/dL    Imaging / Studies: No results found.  Medications / Allergies: per chart  Antibiotics: Anti-infectives   Start     Dose/Rate Route Frequency Ordered Stop   06/09/12 2200  cefoTEtan (CEFOTAN) 2 g in dextrose 5 % 50 mL IVPB     2 g 100 mL/hr over 30 Minutes Intravenous Every 12 hours 06/09/12 1916 06/09/12 2156   06/09/12 1039  cefOXitin (MEFOXIN) 2 g in dextrose 5 % 50 mL IVPB     2 g 100 mL/hr over 30  Minutes Intravenous On call to O.R. 06/09/12 1040 06/09/12 1500

## 2012-06-13 ENCOUNTER — Encounter (HOSPITAL_COMMUNITY): Payer: Self-pay | Admitting: General Surgery

## 2012-06-13 NOTE — Progress Notes (Signed)
4 Days Post-Op  Subjective: Feels good.  Tolerating solid diet. Passing gas.  Bowels moving.  Walking.  Objective: Vital signs in last 24 hours: Temp:  [98.4 F (36.9 C)-98.8 F (37.1 C)] 98.4 F (36.9 C) (03/10 0525) Pulse Rate:  [70-83] 76 (03/10 0525) Resp:  [16-17] 16 (03/10 0525) BP: (131-155)/(81-88) 133/81 mmHg (03/10 0525) SpO2:  [94 %-99 %] 99 % (03/10 0525) Last BM Date: 06/12/12  Intake/Output from previous day: 03/09 0701 - 03/10 0700 In: 960 [P.O.:960] Out: 25 [Drains:25] Intake/Output this shift:    PE: General- In NAD Abdomen-soft, incisions clean and intact  Lab Results:  No results found for this basename: WBC, HGB, HCT, PLT,  in the last 72 hours BMET No results found for this basename: NA, K, CL, CO2, GLUCOSE, BUN, CREATININE, CALCIUM,  in the last 72 hours PT/INR No results found for this basename: LABPROT, INR,  in the last 72 hours Comprehensive Metabolic Panel:    Component Value Date/Time   NA 138 06/10/2012 0420   K 4.0 06/10/2012 0420   CL 102 06/10/2012 0420   CO2 25 06/10/2012 0420   BUN 14 06/10/2012 0420   CREATININE 0.75 06/10/2012 0420   GLUCOSE 165* 06/10/2012 0420   CALCIUM 9.0 06/10/2012 0420   AST 28 05/30/2012 0815   ALT 37 05/30/2012 0815   ALKPHOS 59 05/30/2012 0815   BILITOT 0.3 05/30/2012 0815   PROT 7.1 05/30/2012 0815   ALBUMIN 4.1 05/30/2012 0815     Studies/Results: No results found.  Anti-infectives: Anti-infectives   Start     Dose/Rate Route Frequency Ordered Stop   06/09/12 2200  cefoTEtan (CEFOTAN) 2 g in dextrose 5 % 50 mL IVPB     2 g 100 mL/hr over 30 Minutes Intravenous Every 12 hours 06/09/12 1916 06/09/12 2156   06/09/12 1039  cefOXitin (MEFOXIN) 2 g in dextrose 5 % 50 mL IVPB     2 g 100 mL/hr over 30 Minutes Intravenous On call to O.R. 06/09/12 1040 06/09/12 1500      Assessment Principal Problem:   Sigmoid diverticulitis-recurrent s/p laparoscopic sigmoid colectomy 06/09/12-bowel function has returned.   Tolerating diet. Active Problems:   BPH (benign prostatic hypertrophy)-on Flomax   Chronic back pain    LOS: 4 days   Plan:  Discharge today.  Instructions given.   ROSENBOWER,TODD J 06/13/2012

## 2012-06-16 NOTE — Discharge Summary (Signed)
Physician Discharge Summary  Patient ID: Micheal [name] MRN: [id number] DOB/AGE: 11/11/1955 57 y.o.  Admit date: 06/09/2012 Discharge date: 06/13/2012  Admission Diagnoses:  Recurrent sigmoid diverticulitis  Discharge Diagnoses:  Principal Problem:   Sigmoid diverticulitis-recurrent s/p laparoscopic sigmoid colectomy 06/09/12 Active Problems:   BPH (benign prostatic hypertrophy)   Chronic back pain   Discharged Condition: good  Hospital Course: He was admitted and underwent the above procedure which he tolerated well. Postoperatively he was started on a liquid diet and slowly advanced to regular diet. He had return of bowel function on his second postoperative day. He was ambulatory. His drain was  removed on his third postoperative day. On his fourth postoperative day he was tolerating a solid diet, his bowels are moving, he was ambulating independently, he was felt to be ready for discharge.  He was given specific discharge instructions and will followup in the office next week for staple removal.  Consults: None  Significant Diagnostic Studies: none  Treatments: surgery: Laparoscopic-assisted sigmoid colectomy  Discharge Exam: Blood pressure 133/81, pulse 76, temperature 98.4 F (36.9 C), temperature source Oral, resp. rate 16, height 6' 1"  (1.854 m), weight 220 lb (99.791 kg), SpO2 99.00%.   Disposition: 01-Home or Self Care   Future Appointments Provider Department Dept Phone   06/17/2012 10:00 AM [facility] Nurse [facility], [place] [phone]   06/30/2012 2:40 PM Odis Hollingshead, MD [facility], [place] [phone]       Medication List    STOP taking these medications       amoxicillin-clavulanate 875-125 MG per tablet  Commonly known as:  AUGMENTIN      TAKE these medications       ALPRAZolam 0.5 MG tablet  Commonly known as:  XANAX  Take 0.5 mg by mouth at bedtime as needed for sleep or anxiety.     DEPLIN 15 MG Tabs  Take 1 tablet  by mouth daily before breakfast.     oxyCODONE-acetaminophen 10-325 MG per tablet  Commonly known as:  PERCOCET  Take 1 tablet by mouth every 4 (four) hours as needed for pain.     pantoprazole 40 MG tablet  Commonly known as:  PROTONIX  Take 40 mg by mouth daily.     tamsulosin 0.4 MG Caps  Commonly known as:  FLOMAX  Take 0.4 mg by mouth at bedtime.         Signed: Odis Hollingshead 06/16/2012, 9:27 AM

## 2012-06-17 ENCOUNTER — Ambulatory Visit (INDEPENDENT_AMBULATORY_CARE_PROVIDER_SITE_OTHER): Payer: Medicare Other

## 2012-06-17 DIAGNOSIS — Z4802 Encounter for removal of sutures: Secondary | ICD-10-CM

## 2012-06-30 ENCOUNTER — Ambulatory Visit (INDEPENDENT_AMBULATORY_CARE_PROVIDER_SITE_OTHER): Payer: Medicare Other | Admitting: General Surgery

## 2012-06-30 ENCOUNTER — Encounter (INDEPENDENT_AMBULATORY_CARE_PROVIDER_SITE_OTHER): Payer: Self-pay | Admitting: General Surgery

## 2012-06-30 ENCOUNTER — Encounter (INDEPENDENT_AMBULATORY_CARE_PROVIDER_SITE_OTHER): Payer: Self-pay

## 2012-06-30 VITALS — BP 120/82 | HR 84 | Resp 16 | Ht 73.0 in | Wt 218.0 lb

## 2012-06-30 DIAGNOSIS — Z9889 Other specified postprocedural states: Secondary | ICD-10-CM

## 2012-06-30 NOTE — Patient Instructions (Signed)
Try a high fiber diet. Continue light activities.

## 2012-06-30 NOTE — Progress Notes (Signed)
Post dated note from nurse visit 06/17/12.  Documented visit for staple removal.  Pt. Is p.o. Partial lap colectomy and presents for staple removal.  Wound healing nicely.  Pt is somewhat nervous about the staple removal.  He is reassured.  Staples removed and steri strips placed on the incision.  Pt to return for scheduled p.o. appt with Dr. Zella Richer.

## 2012-06-30 NOTE — Progress Notes (Signed)
Procedure:  Laparoscopic-assisted sigmoid colectomy  Date:  06/09/2012  Pathology:  Diverticulosis and diverticulitis  History:  He is here for a postoperative visit. He is eating well. His bowels are moving. He is fairly comfortable.  Exam: General- Is in NAD. Abdomen-soft, all incisions are clean and intact.  Assessment:  Doing well postoperatively.  Plan:  Continue light activities. Try high fiber diet. Return visit 3 weeks.

## 2012-06-30 NOTE — Progress Notes (Unsigned)
Patient ID: Micheal [name], male   DOB: 01/12/1956, 57 y.o.   MRN: [id number]

## 2012-07-25 ENCOUNTER — Encounter (INDEPENDENT_AMBULATORY_CARE_PROVIDER_SITE_OTHER): Payer: Self-pay | Admitting: General Surgery

## 2012-07-25 ENCOUNTER — Ambulatory Visit (INDEPENDENT_AMBULATORY_CARE_PROVIDER_SITE_OTHER): Payer: Medicare Other | Admitting: General Surgery

## 2012-07-25 VITALS — BP 132/86 | HR 90 | Temp 98.1°F | Resp 16 | Ht 73.0 in | Wt 211.6 lb

## 2012-07-25 DIAGNOSIS — Z9889 Other specified postprocedural states: Secondary | ICD-10-CM

## 2012-07-25 NOTE — Patient Instructions (Signed)
May resume normal activities as tolerated as we discussed.

## 2012-07-25 NOTE — Progress Notes (Signed)
Procedure:  Laparoscopic-assisted sigmoid colectomy  Date:  06/09/2012  Pathology:  Diverticulosis and diverticulitis  History:  He is here for a second postoperative visit and is doing well.  He is ready to resume his normal activities.  Exam: General- Is in NAD. Abdomen-soft, all incisions are clean,solid, and intact.  Assessment:  Contiues to do well postoperatively.  Plan:  Resume normal activities.  Life long high fiber diet.  Return visit prn.

## 2013-10-23 ENCOUNTER — Ambulatory Visit (INDEPENDENT_AMBULATORY_CARE_PROVIDER_SITE_OTHER): Payer: Medicare Other | Admitting: General Surgery

## 2013-10-23 ENCOUNTER — Encounter (INDEPENDENT_AMBULATORY_CARE_PROVIDER_SITE_OTHER): Payer: Self-pay | Admitting: General Surgery

## 2013-10-23 VITALS — BP 140/100 | HR 100 | Resp 16 | Ht 74.0 in | Wt 218.4 lb

## 2013-10-23 DIAGNOSIS — R109 Unspecified abdominal pain: Secondary | ICD-10-CM

## 2013-10-23 DIAGNOSIS — R1032 Left lower quadrant pain: Secondary | ICD-10-CM

## 2013-10-23 NOTE — Progress Notes (Signed)
Patient ID: Micheal [name], male   DOB: 01/28/1956, 58 y.o.   MRN: [id number]  Chief Complaint  Patient presents with  . diverticulitis of colon    HPI Micheal [name] is a 58 y.o. male.   HPI  He is referred by Dr. Domingo Cocking because of the development of left groin pain. There is a question of a left inguinal hernia. Two weeks ago, he was watching TV and coughed or sneezed then developed some sharp pain in the left groin. This persisted. He went and saw Dr. Domingo Cocking. There was a question of a left inguinal hernia and he has been sent here. He has not felt a bulge. The pain has resolved at this time.  Past Medical History  Diagnosis Date  . Allergy   . GERD (gastroesophageal reflux disease)   . Crohn's   . Diverticulosis of colon (without mention of hemorrhage)   . Pelvic abscess   . Arthritis   . Tubular adenoma of colon 11/04/11    x 2  . Anxiety     Past Surgical History  Procedure Laterality Date  . Popliteal synovial cyst excision      both kness  . Back surgery      L4/5  . Incisional hernia repair    . Rt. knee arthroscopy      x4  . Excisional hemorrhoidectomy    . Tonsillectomy and adenoidectomy    . Appendectomy    . Colon surgery       resection of terminal ileum/right colon 1993/crohns  . Hernia repair      lt. groin  . Laparoscopic partial colectomy N/A 06/09/2012    Procedure: LAPAROSCOPIC assisted PARTIAL COLECTOMY open ;  Surgeon: Odis Hollingshead, MD;  Location: [facility];  Service: General;  Laterality: N/A;    Family History  Problem Relation Age of Onset  . Heart disease Father   . Cirrhosis Mother     alcohol induced  . Colon cancer Neg Hx     Social History History  Substance Use Topics  . Smoking status: Never Smoker   . Smokeless tobacco: Never Used  . Alcohol Use: No    Allergies  Allergen Reactions  . Codeine Itching    REACTION: itching    Current Outpatient Prescriptions  Medication Sig Dispense Refill  . ALPRAZolam (XANAX) 0.5 MG  tablet Take 0.5 mg by mouth at bedtime as needed for sleep or anxiety.       Marland Kitchen L-Methylfolate (DEPLIN) 15 MG TABS Take 1 tablet by mouth daily before breakfast.      . oxyCODONE-acetaminophen (PERCOCET) 10-325 MG per tablet Take 1 tablet by mouth every 4 (four) hours as needed for pain.       . pantoprazole (PROTONIX) 40 MG tablet Take 40 mg by mouth daily.       . Tamsulosin HCl (FLOMAX) 0.4 MG CAPS Take 0.4 mg by mouth at bedtime.        No current facility-administered medications for this visit.    Review of Systems Review of Systems  Constitutional: Negative.   HENT: Negative.   Respiratory: Negative.   Cardiovascular: Negative.   Gastrointestinal: Negative.   Genitourinary:       Dribbling  Musculoskeletal: Positive for arthralgias.    Blood pressure 140/100, pulse 100, resp. rate 16, height 6' 2"  (1.88 m), weight 218 lb 6.4 oz (99.066 kg).  Physical Exam Physical Exam  Constitutional: He appears well-developed and well-nourished. No distress.  HENT:  Head:  Normocephalic.  Abdominal: Soft.  Midline scar. No hernia.  Genitourinary:  Right groin scar. No right groin bulge. No testicular masses. Mild left groin tenderness but no bulge with Valsalva maneuver.    Data Reviewed Note from Dr. Domingo Cocking.  Assessment    Left groin pain which makes be secondary to very mild left groin strain. No evidence of hernia on exam. Currently asymptomatic.     Plan    Resume normal activities as tolerated. Return visit if he notices a bulge.        Tavoris Brisk J 10/23/2013, 10:34 AM

## 2013-10-23 NOTE — Patient Instructions (Signed)
No hernia found.  Resume normal activities as tolerated.

## 2014-01-26 ENCOUNTER — Other Ambulatory Visit: Payer: Self-pay | Admitting: Urology

## 2014-01-31 ENCOUNTER — Encounter (HOSPITAL_BASED_OUTPATIENT_CLINIC_OR_DEPARTMENT_OTHER): Payer: Self-pay | Admitting: *Deleted

## 2014-02-01 ENCOUNTER — Encounter (HOSPITAL_BASED_OUTPATIENT_CLINIC_OR_DEPARTMENT_OTHER): Payer: Self-pay | Admitting: *Deleted

## 2014-02-01 NOTE — Progress Notes (Signed)
02/01/14 1116  OBSTRUCTIVE SLEEP APNEA  Have you ever been diagnosed with sleep apnea through a sleep study? No  Do you snore loudly (loud enough to be heard through closed doors)?  1  Do you often feel tired, fatigued, or sleepy during the daytime? 0  Has anyone observed you stop breathing during your sleep? 1  Do you have, or are you being treated for high blood pressure? 0  BMI more than 35 kg/m2? 0  Age over 59 years old? 1  Neck circumference greater than 40 cm/16 inches? 1  Gender: 1  Obstructive Sleep Apnea Score 5  Score 4 or greater  Results sent to PCP

## 2014-02-01 NOTE — Progress Notes (Signed)
NPO AFTER MN. ARRIVE AT 0600. NEEDS HG .  WILL TAKE AM  MEDS W/ SIPS OF WATER DOS. REVIEWED [facility] GUIDELINES, WILL BRING MEDS.

## 2014-02-02 NOTE — H&P (Signed)
Reason For Visit     Micheal [name] returns today after about a 2 year hiatus from our practice. He had some to see me with some fairly longstanding and progressive voiding symptoms. His AUA symptom score was over 20 and he was fairly bothered by his problems. He had been on alpha-blocker therapy and eventually on 2 tablets of tamsulosin a day. We put him through video urodynamics about 2 years ago. He had fairly good bladder emptying. Bladder did not show any significant instability. On pressure flow studies, he was primarily in the obstructive category. We felt he was a reasonable candidate for TURP, but he wanted to hold off. His situation has continued to progress. He continues to complain of mostly postvoid dribbling, as well as some fairly severe nocturia 4-5 times per evening. He has fairly significant obstructive and irritative symptoms. Two years ago, PSA was well within normal limits at 0.5, but that has not been rechecked. His postvoid residual today was 53 mL.     History of Present Illness   Past Gu Hx:       Micheal [name] is 58 years of age without any real defined prior urologic history. He has had some longstanding and progressive voiding complaints. He tells me that while he was applying for disability, he went a couple of years without really having any medical assessment. He has had progressive, obstructive and irritative voiding symptoms. He was started on tamsulosin and now is currently taking 2 tablets a day. Initially that seemed to help fairly well but now is really no longer resulting in much improvement. His AUA symptom score is 23, which puts him solidly in the severe category. His bothersome score is 4. Biggest complaints include getting up at night up to 4 times or more. He also has a weak stream with some intermittency. He has not really had gross hematuria but apparently noted a blood spot in his underwear. He otherwise has not really had any dysuria. He has had a burning sensation in  his groin. His urinalysis today here in our office is actually clear. He has had some significant chronic back problems and is in a pain management clinic. He also has had problems with diverticulitis and bowel resection.    In summary the patient had fairly normal sensation without evidence of instability. He was able to void approximately 500 cc with a flow rate of 10 mL per second. Detrusor pressure was around 50 cm of water pressure but peaked at over 100. Pressure flow curves up with him in the equivocal due mostly obstructed category.      Past Medical History Problems  1. History of Anxiety (F41.9) 2. History of Arthritis 3. History of Crohn's disease (K50.90) 4. History of Gastric ulcer (K25.9) 5. History of Gout (M10.9)  Surgical History Problems  1. History of Back Surgery 2. History of Colon Surgery 3. History of Excision Of Baker's Cyst 4. History of Hernia Repair 5. History of Inguinal Hernia Repair 6. History of Knee Surgery  Current Meds 1. ALPRAZolam 1 MG Oral Tablet;  Therapy: 30Aug2013 to Recorded 2. Deplin 15 MG TABS;  Therapy: (Recorded:27Sep2013) to Recorded 3. Diazepam 5 MG Oral Tablet;  Therapy: [id number] to Recorded 4. Oxycodone-Acetaminophen 10-325 MG Oral Tablet;  Therapy: [id number] to Recorded 5. Pantoprazole Sodium 40 MG Oral Tablet Delayed Release;  Therapy: 02Apr2013 to Recorded 6. Tamsulosin HCl - 0.4 MG Oral Capsule;  Therapy: 02Apr2013 to Recorded  Allergies Medication  1. Codeine Derivatives  Family History Problems  1. Family history of Acute Myocardial Infarction : Father 2. Family history of Chronic Liver Disease : Mother 3. Family history of Family Health Status - Father's Age : Father   27yr, MI 4. Family history of Family Health Status - Mother's Age : Mother   487yr Liver dz 5. Family history of Family Health Status Number Of Children   1 son  Social History Problems  1. Denied: History of Alcohol Use 2. Caffeine  Use   5 qd 3. Former smoker ([phone]. Marital History - Currently Married 5. 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 Denied: History of Tobacco Use   1 ppd for [age]yrnonsmoker for the past 15y15yreview of Systems Genitourinary, constitutional, skin, eye, otolaryngeal, hematologic/lymphatic, cardiovascular, pulmonary, endocrine, musculoskeletal, gastrointestinal, neurological and psychiatric system(s) were reviewed and pertinent findings if present are noted.  Genitourinary: urinary frequency, feelings of urinary urgency, nocturia, difficulty starting the urinary stream, weak urinary stream, incomplete emptying of bladder and post-void dribbling, but no dysuria and no hematuria.  Constitutional: feeling tired (fatigue).  ENT: sinus problems.  Musculoskeletal: back pain and joint pain.  Psychiatric: anxiety and depression.    Vitals Vital Signs [Data Includes: Last 1 Day]  Recorded: 20Oct2015 11:38AM  Height: 6 ft 1 in Weight: 216 lb  BMI Calculated: 28.5 BSA Calculated: 2.22 Blood Pressure: 154 / 97 Temperature: 98 F Heart Rate: 76  Physical Exam  Well developed well-nourished male in no acute distress Respiratory: Normal effort Cardiac: Regular rate and rhythm Abdomen: Soft nontender Extremities: No tenderness or edema Rectal: Rectal exam demonstrates normal sphincter tone, no tenderness and no masses. Estimated prostate size is 1+. Normal rectal tone, no rectal masses, prostate is smooth, symmetric and non-tender. The prostate has no nodularity and is not tender. The left seminal vesicle is nonpalpable. The right seminal vesicle is nonpalpable. The perineum is normal on inspection.    Results/Data Urine [Data Includes: Last 1 Day]   20Oct2015 COLOR YELLOW  APPEARANCE CLEAR  SPECIFIC GRAVITY <1.005  pH 6.0  GLUCOSE NEG mg/dL BILIRUBIN NEG  KETONE NEG mg/dL BLOOD TRACE  PROTEIN NEG mg/dL UROBILINOGEN 0.2 mg/dL NITRITE NEG  LEUKOCYTE ESTERASE NEG  SQUAMOUS  EPITHELIAL/HPF RARE  WBC 0-2 WBC/hpf RBC NONE SEEN RBC/hpf BACTERIA NONE SEEN  CRYSTALS NONE SEEN  CASTS NONE SEEN   Assessment Assessed  1. Benign prostatic hyperplasia with urinary obstruction (N40.1)  Plan Benign prostatic hyperplasia with urinary obstruction  1. PSA; Status:Hold For - Specimen/Data Collection,Appointment; Requested  for:[id number]. Follow-up Schedule Surgery Office  Follow-up  Status: Hold For - Appointment   Requested for: 20Oct2015 Health Maintenance  3. UA With REFLEX; [Do Not Release]; Status:Resulted - Requires Verification;   Done:  20Oct2015 11:22AM  Discussion/Summary   Micheal [name] some very longstanding and progressive voiding symptoms. He has responded reasonably to alpha-blockers, but they have lost their effectiveness. He is more and more bothered by his symptoms and is interested in a more definitive procedure. I do think he is an excellent candidate for a Gyrus TURP. Urodynamics did show evidence of obstruction without real bladder instability. I think there is a good chance he could get off all of his medications. There is a very high likelihood, but certainly not a 100%923%nce that his voiding symptoms will dramatically improve/resolve. Biggest side effect is going to be retrograde ejaculation. He is interested in proceeding. We need to check a PSA today, but it has always been within normal limits. We will otherwise get him  scheduled for some in the next several weeks, if that will work for both our schedules.      Signatures Electronically signed by : Rana Snare, M.D.; Jan 23 2014  1:28PM EST

## 2014-02-04 NOTE — Anesthesia Preprocedure Evaluation (Addendum)
Anesthesia Evaluation  Patient identified by MRN, date of birth, ID band Patient awake    Reviewed: Allergy & Precautions, H&P , NPO status , Patient's Chart, lab work & pertinent test results  History of Anesthesia Complications Negative for: history of anesthetic complications  Airway Mallampati: III  TM Distance: >3 FB Neck ROM: Full    Dental  (+) Dental Advisory Given, Teeth Intact   Pulmonary neg pulmonary ROS, former smoker,  breath sounds clear to auscultation  Pulmonary exam normal       Cardiovascular Exercise Tolerance: Good negative cardio ROS  Rhythm:Regular Rate:Normal     Neuro/Psych PSYCHIATRIC DISORDERS Anxiety negative neurological ROS     GI/Hepatic Neg liver ROS, GERD-  Medicated and Controlled,Hx of crohns   Endo/Other  negative endocrine ROS  Renal/GU negative Renal ROS  negative genitourinary   Musculoskeletal  (+) Arthritis -, Osteoarthritis,    Abdominal   Peds negative pediatric ROS (+)  Hematology negative hematology ROS (+)   Anesthesia Other Findings Crowded mouth, narrow palate  Reproductive/Obstetrics negative OB ROS                      Anesthesia Physical Anesthesia Plan  ASA: II  Anesthesia Plan: General   Post-op Pain Management:    Induction: Intravenous  Airway Management Planned: LMA  Additional Equipment:   Intra-op Plan:   Post-operative Plan: Extubation in OR  Informed Consent: I have reviewed the patients History and Physical, chart, labs and discussed the procedure including the risks, benefits and alternatives for the proposed anesthesia with the patient or authorized representative who has indicated his/her understanding and acceptance.   Dental advisory given  Plan Discussed with: CRNA  Anesthesia Plan Comments:         Anesthesia Quick Evaluation

## 2014-02-05 ENCOUNTER — Ambulatory Visit (HOSPITAL_BASED_OUTPATIENT_CLINIC_OR_DEPARTMENT_OTHER): Payer: Medicare Other | Admitting: Anesthesiology

## 2014-02-05 ENCOUNTER — Encounter (HOSPITAL_BASED_OUTPATIENT_CLINIC_OR_DEPARTMENT_OTHER): Payer: Self-pay | Admitting: *Deleted

## 2014-02-05 ENCOUNTER — Ambulatory Visit (HOSPITAL_BASED_OUTPATIENT_CLINIC_OR_DEPARTMENT_OTHER)
Admission: RE | Admit: 2014-02-05 | Discharge: 2014-02-06 | Disposition: A | Discharge status: Home / Self Care | Payer: Medicare Other | Source: Ambulatory Visit | Attending: Urology | Admitting: Urology

## 2014-02-05 ENCOUNTER — Encounter (HOSPITAL_BASED_OUTPATIENT_CLINIC_OR_DEPARTMENT_OTHER)
Admission: RE | Disposition: A | Discharge status: Home / Self Care | Payer: Self-pay | Source: Ambulatory Visit | Attending: Urology

## 2014-02-05 DIAGNOSIS — F419 Anxiety disorder, unspecified: Secondary | ICD-10-CM | POA: Diagnosis not present

## 2014-02-05 DIAGNOSIS — Z87891 Personal history of nicotine dependence: Secondary | ICD-10-CM | POA: Insufficient documentation

## 2014-02-05 DIAGNOSIS — Z79899 Other long term (current) drug therapy: Secondary | ICD-10-CM | POA: Diagnosis not present

## 2014-02-05 DIAGNOSIS — K5090 Crohn's disease, unspecified, without complications: Secondary | ICD-10-CM | POA: Insufficient documentation

## 2014-02-05 DIAGNOSIS — M1990 Unspecified osteoarthritis, unspecified site: Secondary | ICD-10-CM | POA: Insufficient documentation

## 2014-02-05 DIAGNOSIS — R3915 Urgency of urination: Secondary | ICD-10-CM | POA: Diagnosis not present

## 2014-02-05 DIAGNOSIS — N401 Enlarged prostate with lower urinary tract symptoms: Secondary | ICD-10-CM | POA: Insufficient documentation

## 2014-02-05 DIAGNOSIS — Z885 Allergy status to narcotic agent status: Secondary | ICD-10-CM | POA: Insufficient documentation

## 2014-02-05 DIAGNOSIS — R3912 Poor urinary stream: Secondary | ICD-10-CM | POA: Diagnosis not present

## 2014-02-05 DIAGNOSIS — R351 Nocturia: Secondary | ICD-10-CM | POA: Diagnosis not present

## 2014-02-05 DIAGNOSIS — N3943 Post-void dribbling: Secondary | ICD-10-CM | POA: Diagnosis not present

## 2014-02-05 DIAGNOSIS — M109 Gout, unspecified: Secondary | ICD-10-CM | POA: Diagnosis not present

## 2014-02-05 DIAGNOSIS — N138 Other obstructive and reflux uropathy: Secondary | ICD-10-CM | POA: Diagnosis present

## 2014-02-05 HISTORY — DX: Personal history of other diseases of the digestive system: Z87.19

## 2014-02-05 HISTORY — DX: Personal history of colon polyps, unspecified: Z86.0100

## 2014-02-05 HISTORY — DX: Low back pain: M54.5

## 2014-02-05 HISTORY — DX: Other specified personal risk factors, not elsewhere classified: Z91.89

## 2014-02-05 HISTORY — PX: TRANSURETHRAL RESECTION OF PROSTATE: SHX73

## 2014-02-05 HISTORY — DX: Other chronic pain: G89.29

## 2014-02-05 HISTORY — DX: Diverticulosis of large intestine without perforation or abscess without bleeding: K57.30

## 2014-02-05 HISTORY — DX: Low back pain, unspecified: M54.50

## 2014-02-05 HISTORY — DX: Benign prostatic hyperplasia without lower urinary tract symptoms: N40.0

## 2014-02-05 HISTORY — DX: Personal history of colonic polyps: Z86.010

## 2014-02-05 HISTORY — DX: Crohn's disease, unspecified, without complications: K50.90

## 2014-02-05 HISTORY — DX: Personal history of peptic ulcer disease: Z87.11

## 2014-02-05 LAB — POCT HEMOGLOBIN-HEMACUE: HEMOGLOBIN: 14.9 g/dL (ref 13.0–17.0)

## 2014-02-05 SURGERY — TRANSURETHRAL RESECTION OF THE PROSTATE WITH GYRUS INSTRUMENTS
Anesthesia: General | Site: Prostate

## 2014-02-05 MED ORDER — PANTOPRAZOLE SODIUM 40 MG PO TBEC
40.0000 mg | DELAYED_RELEASE_TABLET | Freq: Every morning | ORAL | Status: DC
  Administered 2014-02-06: 40 mg via ORAL
  Filled 2014-02-05: qty 1

## 2014-02-05 MED ORDER — FENTANYL CITRATE 0.05 MG/ML IJ SOLN
INTRAMUSCULAR | Status: DC | PRN
  Administered 2014-02-05 (×2): 100 ug via INTRAVENOUS

## 2014-02-05 MED ORDER — ONDANSETRON HCL 4 MG/2ML IJ SOLN
4.0000 mg | Freq: Once | INTRAMUSCULAR | Status: AC | PRN
  Filled 2014-02-05: qty 2

## 2014-02-05 MED ORDER — MORPHINE SULFATE 4 MG/ML IJ SOLN
INTRAMUSCULAR | Status: AC
  Filled 2014-02-05: qty 1

## 2014-02-05 MED ORDER — BELLADONNA ALKALOIDS-OPIUM 16.2-60 MG RE SUPP
RECTAL | Status: DC | PRN
  Administered 2014-02-05: 1 via RECTAL

## 2014-02-05 MED ORDER — DEPLIN 15 MG PO TABS
1.0000 | ORAL_TABLET | Freq: Every day | ORAL | Status: DC
  Administered 2014-02-06: 1 via ORAL

## 2014-02-05 MED ORDER — KCL IN DEXTROSE-NACL 20-5-0.9 MEQ/L-%-% IV SOLN
INTRAVENOUS | Status: DC
  Administered 2014-02-05 – 2014-02-06 (×2): via INTRAVENOUS
  Filled 2014-02-05: qty 1000

## 2014-02-05 MED ORDER — MORPHINE SULFATE 2 MG/ML IJ SOLN
2.0000 mg | INTRAMUSCULAR | Status: DC | PRN
  Administered 2014-02-05 (×3): 4 mg via INTRAVENOUS
  Filled 2014-02-05: qty 2

## 2014-02-05 MED ORDER — OXYCODONE-ACETAMINOPHEN 5-325 MG PO TABS
1.0000 | ORAL_TABLET | ORAL | Status: DC | PRN
  Administered 2014-02-05 – 2014-02-06 (×2): 2 via ORAL
  Filled 2014-02-05: qty 2

## 2014-02-05 MED ORDER — SODIUM CHLORIDE 0.9 % IR SOLN
Status: DC | PRN
  Administered 2014-02-05: 9000 mL

## 2014-02-05 MED ORDER — CIPROFLOXACIN IN D5W 400 MG/200ML IV SOLN
400.0000 mg | Freq: Two times a day (BID) | INTRAVENOUS | Status: AC
  Filled 2014-02-05: qty 200

## 2014-02-05 MED ORDER — CIPROFLOXACIN IN D5W 400 MG/200ML IV SOLN
400.0000 mg | INTRAVENOUS | Status: AC
  Administered 2014-02-05: 400 mg via INTRAVENOUS
  Filled 2014-02-05: qty 200

## 2014-02-05 MED ORDER — MIDAZOLAM HCL 2 MG/2ML IJ SOLN
INTRAMUSCULAR | Status: AC
  Filled 2014-02-05: qty 2

## 2014-02-05 MED ORDER — ONDANSETRON HCL 4 MG/2ML IJ SOLN
INTRAMUSCULAR | Status: DC | PRN
  Administered 2014-02-05: 4 mg via INTRAVENOUS

## 2014-02-05 MED ORDER — LACTATED RINGERS IV SOLN
INTRAVENOUS | Status: DC
  Administered 2014-02-05 (×2): via INTRAVENOUS
  Filled 2014-02-05: qty 1000

## 2014-02-05 MED ORDER — BELLADONNA ALKALOIDS-OPIUM 16.2-60 MG RE SUPP
RECTAL | Status: AC
  Filled 2014-02-05: qty 1

## 2014-02-05 MED ORDER — OXYBUTYNIN CHLORIDE 5 MG PO TABS
5.0000 mg | ORAL_TABLET | Freq: Three times a day (TID) | ORAL | Status: DC | PRN
  Filled 2014-02-05: qty 1

## 2014-02-05 MED ORDER — PROPOFOL INFUSION 10 MG/ML OPTIME
INTRAVENOUS | Status: DC | PRN
  Administered 2014-02-05: 50 mL via INTRAVENOUS
  Administered 2014-02-05: 200 mL via INTRAVENOUS

## 2014-02-05 MED ORDER — DOCUSATE SODIUM 100 MG PO CAPS
100.0000 mg | ORAL_CAPSULE | Freq: Two times a day (BID) | ORAL | Status: DC
  Administered 2014-02-05: 100 mg via ORAL
  Filled 2014-02-05: qty 1

## 2014-02-05 MED ORDER — LIDOCAINE HCL (CARDIAC) 20 MG/ML IV SOLN
INTRAVENOUS | Status: DC | PRN
  Administered 2014-02-05: 100 mg via INTRAVENOUS

## 2014-02-05 MED ORDER — OXYCODONE-ACETAMINOPHEN 5-325 MG PO TABS
ORAL_TABLET | ORAL | Status: AC
  Filled 2014-02-05: qty 2

## 2014-02-05 MED ORDER — DEXAMETHASONE SODIUM PHOSPHATE 10 MG/ML IJ SOLN
INTRAMUSCULAR | Status: DC | PRN
  Administered 2014-02-05: 10 mg via INTRAVENOUS

## 2014-02-05 MED ORDER — FENTANYL CITRATE 0.05 MG/ML IJ SOLN
INTRAMUSCULAR | Status: AC
  Filled 2014-02-05: qty 4

## 2014-02-05 MED ORDER — FENTANYL CITRATE 0.05 MG/ML IJ SOLN
25.0000 ug | INTRAMUSCULAR | Status: DC | PRN
  Filled 2014-02-05: qty 1

## 2014-02-05 MED ORDER — ALPRAZOLAM 0.5 MG PO TABS
0.5000 mg | ORAL_TABLET | Freq: Every evening | ORAL | Status: DC | PRN
  Filled 2014-02-05: qty 1

## 2014-02-05 MED ORDER — TAMSULOSIN HCL 0.4 MG PO CAPS
0.8000 mg | ORAL_CAPSULE | Freq: Every day | ORAL | Status: DC
  Filled 2014-02-05: qty 2

## 2014-02-05 MED ORDER — DOCUSATE SODIUM 100 MG PO CAPS
ORAL_CAPSULE | ORAL | Status: AC
  Filled 2014-02-05: qty 1

## 2014-02-05 MED ORDER — MIDAZOLAM HCL 5 MG/5ML IJ SOLN
INTRAMUSCULAR | Status: DC | PRN
  Administered 2014-02-05: 2 mg via INTRAVENOUS

## 2014-02-05 MED ORDER — OXYCODONE-ACETAMINOPHEN 10-325 MG PO TABS: 1.0000 | ORAL_TABLET | ORAL | Status: DC | PRN

## 2014-02-05 MED ORDER — CIPROFLOXACIN IN D5W 400 MG/200ML IV SOLN
INTRAVENOUS | Status: AC
  Filled 2014-02-05: qty 200

## 2014-02-05 MED ORDER — ONDANSETRON HCL 4 MG/2ML IJ SOLN
4.0000 mg | INTRAMUSCULAR | Status: DC | PRN
  Filled 2014-02-05: qty 2

## 2014-02-05 SURGICAL SUPPLY — 25 items
BAG DRAIN URO-CYSTO SKYTR STRL (DRAIN) ×3 IMPLANT
BAG URINE DRAINAGE (UROLOGICAL SUPPLIES) ×3 IMPLANT
BAG URINE LEG 19OZ MD ST LTX (BAG) IMPLANT
CANISTER SUCT LVC 12 LTR MEDI- (MISCELLANEOUS) ×3 IMPLANT
CATH FOLEY 2WAY SLVR  5CC 20FR (CATHETERS)
CATH FOLEY 2WAY SLVR  5CC 22FR (CATHETERS)
CATH FOLEY 2WAY SLVR 5CC 20FR (CATHETERS) IMPLANT
CATH FOLEY 2WAY SLVR 5CC 22FR (CATHETERS) IMPLANT
CATH FOLEY 3WAY 30CC 22F (CATHETERS) ×3 IMPLANT
CLOTH BEACON ORANGE TIMEOUT ST (SAFETY) ×3 IMPLANT
DRAPE CAMERA CLOSED 9X96 (DRAPES) ×3 IMPLANT
ELECT BUTTON HF 24-28F 2 30DE (ELECTRODE) IMPLANT
ELECT LOOP MED HF 24F 12D CBL (CLIP) ×3 IMPLANT
ELECT REM PT RETURN 9FT ADLT (ELECTROSURGICAL)
ELECT RESECT VAPORIZE 12D CBL (ELECTRODE) IMPLANT
ELECTRODE REM PT RTRN 9FT ADLT (ELECTROSURGICAL) IMPLANT
EVACUATOR MICROVAS BLADDER (UROLOGICAL SUPPLIES) IMPLANT
GLOVE BIO SURGEON STRL SZ7.5 (GLOVE) ×6 IMPLANT
GOWN STRL REIN XL XLG (GOWN DISPOSABLE) ×3 IMPLANT
GOWN STRL REUS W/TWL XL LVL3 (GOWN DISPOSABLE) ×3 IMPLANT
HOLDER FOLEY CATH W/STRAP (MISCELLANEOUS) ×3 IMPLANT
PACK CYSTO (CUSTOM PROCEDURE TRAY) ×3 IMPLANT
PLUG CATH AND CAP STER (CATHETERS) ×3 IMPLANT
SET ASPIRATION TUBING (TUBING) ×3 IMPLANT
SYRINGE IRR TOOMEY STRL 70CC (SYRINGE) ×3 IMPLANT

## 2014-02-05 NOTE — Interval H&P Note (Signed)
History and Physical Interval Note:  02/05/2014 7:42 AM  Micheal [name]  has presented today for surgery, with the diagnosis of Benign Prostatic Hyperplasia  The various methods of treatment have been discussed with the patient and family. After consideration of risks, benefits and other options for treatment, the patient has consented to  Procedure(s): TRANSURETHRAL RESECTION OF THE PROSTATE WITH GYRUS INSTRUMENTS (N/A) as a surgical intervention .  The patient's history has been reviewed, patient examined, no change in status, stable for surgery.  I have reviewed the patient's chart and labs.  Questions were answered to the patient's satisfaction.     Haruto Demaria S

## 2014-02-05 NOTE — Op Note (Signed)
Preoperative diagnosis:BPH Postoperative diagnosis:same  Procedure:gyrus TURP   Surgeon: Bernestine Amass M.D.  Anesthesia: Gen.  Indications:58 year old male with long-standing and progressive voiding symptoms. He underwent urodynamics in the past which did show obstruction on pressure flow analysis. He has had ongoing progressive symptoms despite 2 tablets of tamsulosin every day. He requested more definitive intervention. The pros and cons risks and benefits of TURP were discussed with him in detail. He is elected to proceed with saline gyrus TURP.     Technique and findings:patient was brought the operating room we had successful induction general anesthesia. He was placed in lithotomy position and prepped and draped in usual manner. Appropriate surgical timeout was performed. Patient received perioperative antibiotics and placement of PAS compression boots. A continuous flow resectoscope was placed with the visual obturator. Again trilobar hyperplasia with visual obstruction was appreciated. The bladder showed no other pathology. We initially took down the high riding median bar down to capsular fibers at the level of the bladder neck. Lateral lobe tissue was then resected anteriorly to posteriorly on both sides out to the vera montanum. Approximately 15-20 g of prostate tissue was resected. Hemostasis was excellent completion of the procedure. The visual obstruction had resolved and we felt the patient's bladder neck was wide open. All prostate chips are removed and sent for pathologic analysis. A 22 French three-way Foley catheter was inserted and urine was draining very clear to light pink urine at the completion of the procedure. He was taken to recovery room in stable condition.

## 2014-02-05 NOTE — Interval H&P Note (Signed)
History and Physical Interval Note:  02/05/2014 7:42 AM  Micheal [name]  has presented today for surgery, with the diagnosis of Benign Prostatic Hyperplasia  The various methods of treatment have been discussed with the patient and family. After consideration of risks, benefits and other options for treatment, the patient has consented to  Procedure(s): TRANSURETHRAL RESECTION OF THE PROSTATE WITH GYRUS INSTRUMENTS (N/A) as a surgical intervention .  The patient's history has been reviewed, patient examined, no change in status, stable for surgery.  I have reviewed the patient's chart and labs.  Questions were answered to the patient's satisfaction.     Allyce Bochicchio S

## 2014-02-05 NOTE — Anesthesia Procedure Notes (Signed)
Procedure Name: LMA Insertion Date/Time: 02/05/2014 7:49 AM Performed by: Wanita Chamberlain Pre-anesthesia Checklist: Patient identified, Timeout performed, Emergency Drugs available, Suction available and Patient being monitored Patient Re-evaluated:Patient Re-evaluated prior to inductionOxygen Delivery Method: Circle system utilized Preoxygenation: Pre-oxygenation with 100% oxygen Intubation Type: IV induction Ventilation: Mask ventilation without difficulty LMA: LMA inserted LMA Size: 4.0 Number of attempts: 1 Airway Equipment and Method: Bite block Placement Confirmation: breath sounds checked- equal and bilateral and positive ETCO2 Tube secured with: Tape Dental Injury: Teeth and Oropharynx as per pre-operative assessment

## 2014-02-05 NOTE — Transfer of Care (Signed)
Immediate Anesthesia Transfer of Care Note  Patient: Micheal [name]  Procedure(s) Performed: Procedure(s): TRANSURETHRAL RESECTION OF THE PROSTATE WITH GYRUS INSTRUMENTS (N/A)  Patient Location: PACU  Anesthesia Type:General  Level of Consciousness: awake, alert , oriented and patient cooperative  Airway & Oxygen Therapy: Patient Spontanous Breathing and Patient connected to nasal cannula oxygen  Post-op Assessment: Report given to PACU RN and Post -op Vital signs reviewed and stable  Post vital signs: Reviewed and stable  Complications: No apparent anesthesia complications

## 2014-02-05 NOTE — Discharge Instructions (Signed)
Post transurethral resection of the prostate (TURP) instructions  Your recent prostate surgery requires very special post hospital care. Despite the fact that no skin incisions were used the area around the prostate incision is quite raw and is covered with a scab to promote healing and prevent bleeding. Certain cautions are needed to assure that the scab is not disturbed of the next 2-3 weeks while the healing proceeds.  Because the raw surface in your prostate and the irritating effects of urine you may expect frequency of urination and/or urgency (a stronger desire to urinate) and perhaps even getting up at night more often. This will usually resolve or improve slowly over the healing period. You may see some blood in your urine over the first 6 weeks. Do not be alarmed, even if the urine was clear for a while. Get off your feet and drink lots of fluids until clearing occurs. If you start to pass clots or don't improve call us.  Catheter: (If you are discharged with a catheter.) 1. Keep your catheter secured to your leg at all times with tape or the supplied strap. 2. You may experience leakage of urine around your catheter- as long as the  catheter continues to drain, this is normal.  If your catheter stops draining  go to the ER. 3. You may also have blood in your urine, even after it has been clear for  several days; you may even pass some small blood clots or other material.  This  is normal as well.  If this happens, sit down and drink plenty of water to help  make urine to flush out your bladder.  If the blood in your urine becomes worse  after doing this, contact our office or return to the ER. 4. You may use the leg bag (small bag) during the day, but use the large bag at  night.  Diet:  You may return to your normal diet immediately. Because of the raw surface of your bladder, alcohol, spicy foods, foods high in acid and drinks with caffeine may cause irritation or frequency and  should be used in moderation. To keep your urine flowing freely and avoid constipation, drink plenty of fluids during the day (8-10 glasses). Tip: Avoid cranberry juice because it is very acidic.  Activity:  Your physical activity doesn't need to be restricted. However, if you are very active, you may see some blood in the urine. We suggest that you reduce your activity under the circumstances until the bleeding has stopped.  Bowels:  It is important to keep your bowels regular during the postoperative period. Straining with bowel movements can cause bleeding. A bowel movement every other day is reasonable. Use a mild laxative if needed, such as milk of magnesia 2-3 tablespoons, or 2 Dulcolax tablets. Call if you continue to have problems. If you had been taking narcotics for pain, before, during or after your surgery, you may be constipated. Take a laxative if necessary.  Medication:  You should resume your pre-surgery medications unless told not to. DO NOT RESUME YOUR ASPIRIN, WARFARIN, OR OTHER BLOOD THINNER FOR 1 WEEK. In addition you may be given an antibiotic to prevent or treat infection. Antibiotics are not always necessary. All medication should be taken as prescribed until the bottles are finished unless you are having an unusual reaction to one of the drugs.     Problems you should report to [place]:  a. Fever greater than 101F. b. Heavy bleeding, or clots (see  notes above about blood in urine). c. Inability to urinate. d. Drug reactions (hives, rash, nausea, vomiting, diarrhea). e. Severe burning or pain with urination that is not improving.

## 2014-02-05 NOTE — Anesthesia Postprocedure Evaluation (Signed)
  Anesthesia Post-op Note  Patient: Micheal [name]  Procedure(s) Performed: Procedure(s) (LRB): TRANSURETHRAL RESECTION OF THE PROSTATE WITH GYRUS INSTRUMENTS (N/A)  Patient Location: PACU  Anesthesia Type: General  Level of Consciousness: awake and alert   Airway and Oxygen Therapy: Patient Spontanous Breathing  Post-op Pain: mild  Post-op Assessment: Post-op Vital signs reviewed, Patient's Cardiovascular Status Stable, Respiratory Function Stable, Patent Airway and No signs of Nausea or vomiting  Last Vitals:  Filed Vitals:   02/05/14 0900  BP: 100/67  Pulse: 53  Temp:   Resp: 7    Post-op Vital Signs: stable   Complications: No apparent anesthesia complications

## 2014-02-06 DIAGNOSIS — N401 Enlarged prostate with lower urinary tract symptoms: Secondary | ICD-10-CM | POA: Diagnosis not present

## 2014-02-06 LAB — BASIC METABOLIC PANEL
Anion gap: 10 (ref 5–15)
BUN: 10 mg/dL (ref 6–23)
CALCIUM: 8.9 mg/dL (ref 8.4–10.5)
CO2: 28 meq/L (ref 19–32)
CREATININE: 0.96 mg/dL (ref 0.50–1.35)
Chloride: 101 mEq/L (ref 96–112)
GFR calc Af Amer: >90 mL/min (ref >90)
GFR calc non Af Amer: 89 mL/min — ABNORMAL LOW (ref >90)
GLUCOSE: 120 mg/dL — AB (ref 70–99)
Potassium: 3.9 mEq/L (ref 3.7–5.3)
Sodium: 139 mEq/L (ref 137–147)

## 2014-02-06 LAB — HEMOGLOBIN AND HEMATOCRIT, BLOOD
HCT: 39 % (ref 39.0–52.0)
Hemoglobin: 13.3 g/dL (ref 13.0–17.0)

## 2014-02-06 MED ORDER — CIPROFLOXACIN HCL 500 MG PO TABS: 500.0000 mg | ORAL_TABLET | Freq: Two times a day (BID) | ORAL | Status: DC

## 2014-02-06 MED ORDER — OXYCODONE-ACETAMINOPHEN 5-325 MG PO TABS
ORAL_TABLET | ORAL | Status: AC
  Filled 2014-02-06: qty 1

## 2014-02-06 MED ORDER — OXYCODONE HCL 5 MG PO TABS
ORAL_TABLET | ORAL | Status: AC
  Filled 2014-02-06: qty 1

## 2014-02-06 NOTE — Discharge Summary (Signed)
  Patient ID: Micheal [name] MRN: [id number] DOB/AGE: Aug 11, 1955 58 y.o.  Admit date: 02/05/2014 Discharge date: 02/06/2014    Discharge Diagnoses:   Present on Admission:  . BPH with urinary obstruction  Consults:  None     Discharge Medications:   Medication List    TAKE these medications        ALPRAZolam 0.5 MG tablet  Commonly known as:  XANAX  Take 0.5 mg by mouth at bedtime as needed for sleep or anxiety.     ciprofloxacin 500 MG tablet  Commonly known as:  CIPRO  Take 1 tablet (500 mg total) by mouth 2 (two) times daily.     DEPLIN 15 MG Tabs  Take 1 tablet by mouth daily before breakfast.     MIRALAX powder  Generic drug:  polyethylene glycol powder  Take 1 Container by mouth daily as needed.     oxyCODONE-acetaminophen 10-325 MG per tablet  Commonly known as:  PERCOCET  Take 1 tablet by mouth every 4 (four) hours as needed for pain.     pantoprazole 40 MG tablet  Commonly known as:  PROTONIX  Take 40 mg by mouth every morning.     tamsulosin 0.4 MG Caps capsule  Commonly known as:  FLOMAX  Take 0.8 mg by mouth at bedtime.         Significant Diagnostic Studies:  No results found.    Hospital Course:  Active Problems:   BPH with urinary obstruction patient underwent uneventful TURP. He was kept overnight for observation. Urine remained light pink in color. No complications or problems.  Day of Discharge BP 113/61 mmHg  Pulse 58  Temp(Src) 97.6 F (36.4 C) (Oral)  Resp 18  Ht 6' 2"  (1.88 m)  Wt 96.616 kg (213 lb)  BMI 27.34 kg/m2  SpO2 98% Well-developed well-nourished male in no acute distress. Respiratory effort normal. Abdomen soft and nontender. GU  exam shows an indwelling catheter draining clear to very light pink urine. Extremities without edema or tenderness Results for orders placed or performed during the hospital encounter of 02/05/14 (from the past 24 hour(s))  Basic metabolic panel     Status: Abnormal   Collection  Time: 02/06/14  5:15 AM  Result Value Ref Range   Sodium 139 137 - 147 mEq/L   Potassium 3.9 3.7 - 5.3 mEq/L   Chloride 101 96 - 112 mEq/L   CO2 28 19 - 32 mEq/L   Glucose, Bld 120 (H) 70 - 99 mg/dL   BUN 10 6 - 23 mg/dL   Creatinine, Ser 0.96 0.50 - 1.35 mg/dL   Calcium 8.9 8.4 - 10.5 mg/dL   GFR calc non Af Amer 89 (L) >90 mL/min   GFR calc Af Amer >90 >90 mL/min   Anion gap 10 5 - 15  Hemoglobin and hematocrit, blood     Status: None   Collection Time: 02/06/14  5:15 AM  Result Value Ref Range   Hemoglobin 13.3 13.0 - 17.0 g/dL   HCT 39.0 39.0 - 52.0 %

## 2014-02-12 ENCOUNTER — Encounter (HOSPITAL_BASED_OUTPATIENT_CLINIC_OR_DEPARTMENT_OTHER): Payer: Self-pay | Admitting: Urology

## 2014-02-12 DIAGNOSIS — Z79891 Long term (current) use of opiate analgesic: Secondary | ICD-10-CM | POA: Diagnosis not present

## 2014-04-12 DIAGNOSIS — M961 Postlaminectomy syndrome, not elsewhere classified: Secondary | ICD-10-CM | POA: Diagnosis not present

## 2014-04-12 DIAGNOSIS — M179 Osteoarthritis of knee, unspecified: Secondary | ICD-10-CM | POA: Diagnosis not present

## 2014-04-12 DIAGNOSIS — G894 Chronic pain syndrome: Secondary | ICD-10-CM | POA: Diagnosis not present

## 2014-04-12 DIAGNOSIS — M47812 Spondylosis without myelopathy or radiculopathy, cervical region: Secondary | ICD-10-CM | POA: Diagnosis not present

## 2014-05-14 DIAGNOSIS — G894 Chronic pain syndrome: Secondary | ICD-10-CM | POA: Diagnosis not present

## 2014-05-14 DIAGNOSIS — M961 Postlaminectomy syndrome, not elsewhere classified: Secondary | ICD-10-CM | POA: Diagnosis not present

## 2014-05-14 DIAGNOSIS — M179 Osteoarthritis of knee, unspecified: Secondary | ICD-10-CM | POA: Diagnosis not present

## 2014-05-14 DIAGNOSIS — M47812 Spondylosis without myelopathy or radiculopathy, cervical region: Secondary | ICD-10-CM | POA: Diagnosis not present

## 2014-06-11 DIAGNOSIS — M179 Osteoarthritis of knee, unspecified: Secondary | ICD-10-CM | POA: Diagnosis not present

## 2014-06-11 DIAGNOSIS — G894 Chronic pain syndrome: Secondary | ICD-10-CM | POA: Diagnosis not present

## 2014-06-11 DIAGNOSIS — M961 Postlaminectomy syndrome, not elsewhere classified: Secondary | ICD-10-CM | POA: Diagnosis not present

## 2014-06-11 DIAGNOSIS — M47812 Spondylosis without myelopathy or radiculopathy, cervical region: Secondary | ICD-10-CM | POA: Diagnosis not present

## 2014-06-14 DIAGNOSIS — I10 Essential (primary) hypertension: Secondary | ICD-10-CM | POA: Diagnosis not present

## 2014-06-14 DIAGNOSIS — K219 Gastro-esophageal reflux disease without esophagitis: Secondary | ICD-10-CM | POA: Diagnosis not present

## 2014-06-15 DIAGNOSIS — N138 Other obstructive and reflux uropathy: Secondary | ICD-10-CM | POA: Diagnosis not present

## 2014-06-15 DIAGNOSIS — N401 Enlarged prostate with lower urinary tract symptoms: Secondary | ICD-10-CM | POA: Diagnosis not present

## 2014-07-11 DIAGNOSIS — M961 Postlaminectomy syndrome, not elsewhere classified: Secondary | ICD-10-CM | POA: Diagnosis not present

## 2014-07-11 DIAGNOSIS — M47812 Spondylosis without myelopathy or radiculopathy, cervical region: Secondary | ICD-10-CM | POA: Diagnosis not present

## 2014-07-11 DIAGNOSIS — G894 Chronic pain syndrome: Secondary | ICD-10-CM | POA: Diagnosis not present

## 2014-07-11 DIAGNOSIS — M179 Osteoarthritis of knee, unspecified: Secondary | ICD-10-CM | POA: Diagnosis not present

## 2014-07-11 DIAGNOSIS — Z79891 Long term (current) use of opiate analgesic: Secondary | ICD-10-CM | POA: Diagnosis not present

## 2014-08-10 DIAGNOSIS — G894 Chronic pain syndrome: Secondary | ICD-10-CM | POA: Diagnosis not present

## 2014-08-10 DIAGNOSIS — M179 Osteoarthritis of knee, unspecified: Secondary | ICD-10-CM | POA: Diagnosis not present

## 2014-08-10 DIAGNOSIS — M47812 Spondylosis without myelopathy or radiculopathy, cervical region: Secondary | ICD-10-CM | POA: Diagnosis not present

## 2014-08-10 DIAGNOSIS — M961 Postlaminectomy syndrome, not elsewhere classified: Secondary | ICD-10-CM | POA: Diagnosis not present

## 2014-08-29 ENCOUNTER — Encounter: Payer: Self-pay | Admitting: Internal Medicine

## 2014-09-10 DIAGNOSIS — G894 Chronic pain syndrome: Secondary | ICD-10-CM | POA: Diagnosis not present

## 2014-09-10 DIAGNOSIS — M47812 Spondylosis without myelopathy or radiculopathy, cervical region: Secondary | ICD-10-CM | POA: Diagnosis not present

## 2014-09-10 DIAGNOSIS — M179 Osteoarthritis of knee, unspecified: Secondary | ICD-10-CM | POA: Diagnosis not present

## 2014-09-10 DIAGNOSIS — M961 Postlaminectomy syndrome, not elsewhere classified: Secondary | ICD-10-CM | POA: Diagnosis not present

## 2014-10-10 DIAGNOSIS — G894 Chronic pain syndrome: Secondary | ICD-10-CM | POA: Diagnosis not present

## 2014-10-10 DIAGNOSIS — M961 Postlaminectomy syndrome, not elsewhere classified: Secondary | ICD-10-CM | POA: Diagnosis not present

## 2014-10-10 DIAGNOSIS — M179 Osteoarthritis of knee, unspecified: Secondary | ICD-10-CM | POA: Diagnosis not present

## 2014-10-10 DIAGNOSIS — M47812 Spondylosis without myelopathy or radiculopathy, cervical region: Secondary | ICD-10-CM | POA: Diagnosis not present

## 2014-11-09 DIAGNOSIS — M961 Postlaminectomy syndrome, not elsewhere classified: Secondary | ICD-10-CM | POA: Diagnosis not present

## 2014-11-09 DIAGNOSIS — G894 Chronic pain syndrome: Secondary | ICD-10-CM | POA: Diagnosis not present

## 2014-11-09 DIAGNOSIS — M47812 Spondylosis without myelopathy or radiculopathy, cervical region: Secondary | ICD-10-CM | POA: Diagnosis not present

## 2014-11-09 DIAGNOSIS — M179 Osteoarthritis of knee, unspecified: Secondary | ICD-10-CM | POA: Diagnosis not present

## 2014-12-07 DIAGNOSIS — M179 Osteoarthritis of knee, unspecified: Secondary | ICD-10-CM | POA: Diagnosis not present

## 2014-12-07 DIAGNOSIS — M47812 Spondylosis without myelopathy or radiculopathy, cervical region: Secondary | ICD-10-CM | POA: Diagnosis not present

## 2014-12-07 DIAGNOSIS — G894 Chronic pain syndrome: Secondary | ICD-10-CM | POA: Diagnosis not present

## 2014-12-07 DIAGNOSIS — M961 Postlaminectomy syndrome, not elsewhere classified: Secondary | ICD-10-CM | POA: Diagnosis not present

## 2015-01-07 DIAGNOSIS — M961 Postlaminectomy syndrome, not elsewhere classified: Secondary | ICD-10-CM | POA: Diagnosis not present

## 2015-01-07 DIAGNOSIS — M47812 Spondylosis without myelopathy or radiculopathy, cervical region: Secondary | ICD-10-CM | POA: Diagnosis not present

## 2015-01-07 DIAGNOSIS — G894 Chronic pain syndrome: Secondary | ICD-10-CM | POA: Diagnosis not present

## 2015-01-07 DIAGNOSIS — M179 Osteoarthritis of knee, unspecified: Secondary | ICD-10-CM | POA: Diagnosis not present

## 2015-02-05 DIAGNOSIS — G894 Chronic pain syndrome: Secondary | ICD-10-CM | POA: Diagnosis not present

## 2015-02-05 DIAGNOSIS — M47812 Spondylosis without myelopathy or radiculopathy, cervical region: Secondary | ICD-10-CM | POA: Diagnosis not present

## 2015-02-05 DIAGNOSIS — M961 Postlaminectomy syndrome, not elsewhere classified: Secondary | ICD-10-CM | POA: Diagnosis not present

## 2015-02-05 DIAGNOSIS — M179 Osteoarthritis of knee, unspecified: Secondary | ICD-10-CM | POA: Diagnosis not present

## 2015-02-11 DIAGNOSIS — R7301 Impaired fasting glucose: Secondary | ICD-10-CM | POA: Diagnosis not present

## 2015-02-11 DIAGNOSIS — K257 Chronic gastric ulcer without hemorrhage or perforation: Secondary | ICD-10-CM | POA: Diagnosis not present

## 2015-02-11 DIAGNOSIS — I10 Essential (primary) hypertension: Secondary | ICD-10-CM | POA: Diagnosis not present

## 2015-02-18 DIAGNOSIS — N401 Enlarged prostate with lower urinary tract symptoms: Secondary | ICD-10-CM | POA: Diagnosis not present

## 2015-02-18 DIAGNOSIS — R3912 Poor urinary stream: Secondary | ICD-10-CM | POA: Diagnosis not present

## 2015-02-18 DIAGNOSIS — R351 Nocturia: Secondary | ICD-10-CM | POA: Diagnosis not present

## 2015-03-07 DIAGNOSIS — M961 Postlaminectomy syndrome, not elsewhere classified: Secondary | ICD-10-CM | POA: Diagnosis not present

## 2015-03-07 DIAGNOSIS — M47812 Spondylosis without myelopathy or radiculopathy, cervical region: Secondary | ICD-10-CM | POA: Diagnosis not present

## 2015-03-07 DIAGNOSIS — M179 Osteoarthritis of knee, unspecified: Secondary | ICD-10-CM | POA: Diagnosis not present

## 2015-03-07 DIAGNOSIS — G894 Chronic pain syndrome: Secondary | ICD-10-CM | POA: Diagnosis not present

## 2015-04-05 DIAGNOSIS — G894 Chronic pain syndrome: Secondary | ICD-10-CM | POA: Diagnosis not present

## 2015-04-05 DIAGNOSIS — M179 Osteoarthritis of knee, unspecified: Secondary | ICD-10-CM | POA: Diagnosis not present

## 2015-04-05 DIAGNOSIS — M47812 Spondylosis without myelopathy or radiculopathy, cervical region: Secondary | ICD-10-CM | POA: Diagnosis not present

## 2015-04-05 DIAGNOSIS — M961 Postlaminectomy syndrome, not elsewhere classified: Secondary | ICD-10-CM | POA: Diagnosis not present

## 2015-05-06 DIAGNOSIS — M179 Osteoarthritis of knee, unspecified: Secondary | ICD-10-CM | POA: Diagnosis not present

## 2015-05-06 DIAGNOSIS — M961 Postlaminectomy syndrome, not elsewhere classified: Secondary | ICD-10-CM | POA: Diagnosis not present

## 2015-05-06 DIAGNOSIS — G894 Chronic pain syndrome: Secondary | ICD-10-CM | POA: Diagnosis not present

## 2015-05-06 DIAGNOSIS — M47812 Spondylosis without myelopathy or radiculopathy, cervical region: Secondary | ICD-10-CM | POA: Diagnosis not present

## 2015-06-05 DIAGNOSIS — M179 Osteoarthritis of knee, unspecified: Secondary | ICD-10-CM | POA: Diagnosis not present

## 2015-06-05 DIAGNOSIS — G894 Chronic pain syndrome: Secondary | ICD-10-CM | POA: Diagnosis not present

## 2015-06-05 DIAGNOSIS — M961 Postlaminectomy syndrome, not elsewhere classified: Secondary | ICD-10-CM | POA: Diagnosis not present

## 2015-06-05 DIAGNOSIS — Z79891 Long term (current) use of opiate analgesic: Secondary | ICD-10-CM | POA: Diagnosis not present

## 2015-06-05 DIAGNOSIS — Z79899 Other long term (current) drug therapy: Secondary | ICD-10-CM | POA: Diagnosis not present

## 2015-06-05 DIAGNOSIS — M47812 Spondylosis without myelopathy or radiculopathy, cervical region: Secondary | ICD-10-CM | POA: Diagnosis not present

## 2015-07-05 DIAGNOSIS — M179 Osteoarthritis of knee, unspecified: Secondary | ICD-10-CM | POA: Diagnosis not present

## 2015-07-05 DIAGNOSIS — M47812 Spondylosis without myelopathy or radiculopathy, cervical region: Secondary | ICD-10-CM | POA: Diagnosis not present

## 2015-07-05 DIAGNOSIS — M961 Postlaminectomy syndrome, not elsewhere classified: Secondary | ICD-10-CM | POA: Diagnosis not present

## 2015-07-05 DIAGNOSIS — G894 Chronic pain syndrome: Secondary | ICD-10-CM | POA: Diagnosis not present

## 2015-08-02 DIAGNOSIS — M179 Osteoarthritis of knee, unspecified: Secondary | ICD-10-CM | POA: Diagnosis not present

## 2015-08-02 DIAGNOSIS — G894 Chronic pain syndrome: Secondary | ICD-10-CM | POA: Diagnosis not present

## 2015-08-02 DIAGNOSIS — M961 Postlaminectomy syndrome, not elsewhere classified: Secondary | ICD-10-CM | POA: Diagnosis not present

## 2015-08-02 DIAGNOSIS — M47812 Spondylosis without myelopathy or radiculopathy, cervical region: Secondary | ICD-10-CM | POA: Diagnosis not present

## 2015-08-22 DIAGNOSIS — M1712 Unilateral primary osteoarthritis, left knee: Secondary | ICD-10-CM | POA: Diagnosis not present

## 2015-08-22 DIAGNOSIS — M25552 Pain in left hip: Secondary | ICD-10-CM | POA: Diagnosis not present

## 2015-09-03 DIAGNOSIS — G894 Chronic pain syndrome: Secondary | ICD-10-CM | POA: Diagnosis not present

## 2015-09-03 DIAGNOSIS — M47812 Spondylosis without myelopathy or radiculopathy, cervical region: Secondary | ICD-10-CM | POA: Diagnosis not present

## 2015-09-03 DIAGNOSIS — M179 Osteoarthritis of knee, unspecified: Secondary | ICD-10-CM | POA: Diagnosis not present

## 2015-09-03 DIAGNOSIS — M961 Postlaminectomy syndrome, not elsewhere classified: Secondary | ICD-10-CM | POA: Diagnosis not present

## 2015-09-23 ENCOUNTER — Telehealth: Payer: Self-pay | Admitting: Gastroenterology

## 2015-09-23 NOTE — Telephone Encounter (Signed)
I have advised patient that unfortunately, we are unable to fill pantoprazole until he is actually seen in office. He has never been seen by Dr Havery Moros and last office visit with Dr Olevia Perches was 04/2012 for diverticulitis. He verbalizes understanding and will keep upcoming appointment with [place].

## 2015-09-24 ENCOUNTER — Encounter: Payer: Self-pay | Admitting: *Deleted

## 2015-09-25 ENCOUNTER — Encounter: Payer: Self-pay | Admitting: Gastroenterology

## 2015-09-25 ENCOUNTER — Ambulatory Visit (INDEPENDENT_AMBULATORY_CARE_PROVIDER_SITE_OTHER): Payer: Medicare Other | Admitting: Gastroenterology

## 2015-09-25 VITALS — BP 120/80 | HR 80 | Ht 74.0 in | Wt 213.6 lb

## 2015-09-25 DIAGNOSIS — Z8719 Personal history of other diseases of the digestive system: Secondary | ICD-10-CM | POA: Diagnosis not present

## 2015-09-25 DIAGNOSIS — K219 Gastro-esophageal reflux disease without esophagitis: Secondary | ICD-10-CM

## 2015-09-25 DIAGNOSIS — K50919 Crohn's disease, unspecified, with unspecified complications: Secondary | ICD-10-CM

## 2015-09-25 DIAGNOSIS — D126 Benign neoplasm of colon, unspecified: Secondary | ICD-10-CM

## 2015-09-25 MED ORDER — PANTOPRAZOLE SODIUM 20 MG PO TBEC: 20.0000 mg | DELAYED_RELEASE_TABLET | Freq: Every day | ORAL | Status: DC

## 2015-09-25 NOTE — Progress Notes (Signed)
HPI :  60 y/o male previously followed by Dr. Olevia Perches, here for a follow up visit. He has not been seen since 2014. He endorses a history of being diagnosed with Crohns disease when he had ileocolonic resection in the 1990s or so for abdominal pain. Over time he reported being on a sulfasalazine for a period of time, but mostly not on any maintenance therapy, who has not had any overt recurrence of disease. He has not been on any maintenance therapy for Crohn's. His last colonoscopy was in 2013 in which there was no evidence of active Crohns at the surgical anastomosis. He had 2 adenomas removed on that exam and severe diverticulosis.   He otherwise has had a history of recurrent diverticulitis in the past. Patient has had sigmoid resection for diverticulitis in 2014. He said the surgery went well and he has not had any recurrence of diverticulitis since the surgery. He reports he takes percocet routinely for chronic back pain and this in addition with high fiber daily keeps him regular. He has not needed to take miralax. He reports having 1-2 BMs per day. No blood in the stools. No abdominal pains otherwise. Weight stable.  He otherwise states he has been on pantoprazole for "years"  for a reflux and history of "bleeding ulcers" that was evaluated several years ago. He has been on protonix 18m daily for a long time. He reports he ran out of it 4 days agoand he reports he feels it if he doesn't take it his symptoms bother him significantly. His main reflux symptom is epigastric "burning". He denies much pyrosis otherwise. He has not had a prior upper endoscopy. He denies any dysphagia, no odynophagia. He has been out of protonix for the past 4 days or so. No FH of esophageal cancer.   Colonoscopy 10/2011 - 3 polyps removed - 2 adenomas, one benign, severe diverticulosis, told to repeat in 5 years  Past Medical History  Diagnosis Date  . GERD (gastroesophageal reflux disease)   . Arthritis   . Anxiety    . History of colon polyps     tubular adenoma  . History of diverticulitis of colon     sigmoid-  s/p resection  . Diverticulosis of colon   . BPH (benign prostatic hypertrophy)   . Crohn's disease ([place]     no recurrence  . History of gastric ulcer   . Chronic lower back pain   . At risk for sleep apnea     STOP-BANG= 5    SENT TO PCP 01-31-2014  . Hepatic steatosis   . Inguinal hernia   . Adenomatous colon polyp      Past Surgical History  Procedure Laterality Date  . Incisional hernia repair  2000    and Left Inguinal Hernia repair  . Excisional hemorrhoidectomy  age 60 . Laparoscopic partial colectomy N/A 06/09/2012    Procedure: LAPAROSCOPIC assisted PARTIAL COLECTOMY open ;  Surgeon: TOdis Hollingshead MD;  Location: [facility];  Service: General;  Laterality: N/A;  diverticulitis  . Terminal ileocolectomy  1993    and Appendectomy  for CROHN'S  . Lumbar disc surgery  1994    L4 -- L5  . Knee arthroscopy Bilateral X4  last one  2011  . Tonsillectomy and adenoidectomy  as child  . Popliteal synovial cyst excision Bilateral 2001  . Transurethral resection of prostate N/A 02/05/2014    Procedure: TRANSURETHRAL RESECTION OF THE PROSTATE WITH GYRUS INSTRUMENTS;  Surgeon: DShanon Brow  Cy Blamer, MD;  Location: [facility];  Service: Urology;  Laterality: N/A;   Family History  Problem Relation Age of Onset  . Heart disease Father   . Cirrhosis Mother     alcohol induced  . Colon cancer Neg Hx    Social History  Substance Use Topics  . Smoking status: Former Smoker    Types: Cigars    Quit date: 02/02/2004  . Smokeless tobacco: Never Used  . Alcohol Use: No   Current Outpatient Prescriptions  Medication Sig Dispense Refill  . ALPRAZolam (XANAX) 0.5 MG tablet Take 0.5 mg by mouth at bedtime as needed for sleep or anxiety.     Marland Kitchen L-Methylfolate (DEPLIN) 15 MG TABS Take 1 tablet by mouth daily before breakfast.    . oxyCODONE-acetaminophen (PERCOCET) 10-325 MG per  tablet Take 1 tablet by mouth every 4 (four) hours as needed for pain.     . pantoprazole (PROTONIX) 40 MG tablet Take 40 mg by mouth every morning.     . polyethylene glycol powder (MIRALAX) powder Take 1 Container by mouth daily as needed.    . Tamsulosin HCl (FLOMAX) 0.4 MG CAPS Take 0.8 mg by mouth at bedtime.      No current facility-administered medications for this visit.   Allergies  Allergen Reactions  . Codeine Itching     Review of Systems: All systems reviewed and negative except where noted in HPI.   Lab Results  Component Value Date   CREATININE 0.96 02/06/2014   BUN 10 02/06/2014   NA 139 02/06/2014   K 3.9 02/06/2014   CL 101 02/06/2014   CO2 28 02/06/2014    Lab Results  Component Value Date   ALT 37 05/30/2012   AST 28 05/30/2012   ALKPHOS 59 05/30/2012   BILITOT 0.3 05/30/2012    Lab Results  Component Value Date   WBC 15.1* 06/10/2012   HGB 13.3 02/06/2014   HCT 39.0 02/06/2014   MCV 85.9 06/10/2012   PLT 212 06/10/2012   Labs from [place] - "Care everywhere" - showed GFR of 90s in November 2016  Physical Exam: BP 120/80 mmHg  Pulse 80  Ht 6' 2"  (1.88 m)  Wt 213 lb 9.6 oz (96.888 kg)  BMI 27.41 kg/m2 Constitutional: Pleasant,well-developed, male in no acute distress. HEENT: Normocephalic and atraumatic. Conjunctivae are normal. No scleral icterus. Neck supple.  Cardiovascular: Normal rate, regular rhythm.  Pulmonary/chest: Effort normal and breath sounds normal. No wheezing, rales or rhonchi. Abdominal: Soft, nondistended, nontender. Bowel sounds active throughout. There are no masses palpable. No hepatomegaly. Extremities: no edema Lymphadenopathy: No cervical adenopathy noted. Neurological: Alert and oriented to person place and time. Skin: Skin is warm and dry. No rashes noted. Psychiatric: Normal mood and affect. Behavior is normal.   ASSESSMENT AND PLAN: 60 y/o male here for reassessment for the following issues:  GERD -  patient on protonix 75m daily with excellent control of symptoms, eats what he wants without breakthrough. He ran out of protonix 4 days ago and reports significant symptoms that bother him frequently. I discussed the risks / benefits of long term PPI therapy with him. The risks of long term PPIs with current data include increased risk for chronic kidney disease, increased risk of fracture, increased risk of C diff, increased risk of pneumonia (short term usage), potentially increased risk of B12 / calcium deficiency, and rare risk of hypomagnesemia. Recent studies have also shown an association with increased risk of dementia and  cardiovascular outcomes including stroke. These studies have showed an association between PPIs and several of these outcomes but no evidence of causality. The patient was counseled to use the lowest daily use of PPI needed to control symptoms, and will thus dose reduce to 67m daily and see if this dosing can work to control his symptoms. Renal function is currently normal (GFR in 90s as of November) and would recommend checking this yearly while on PPI therapy. He meets ACG criteria for screening EGD for Barrett's but he wishes to hold off at this time, he has no alarm symptoms or dysphagia. We can reassess him in 6-12 months for reassessment of this issue, we can consider doing it at time of his screening colonoscopy.   Reported history of Crohns - remote surgery as above reportedly with pathology showing changes concerning for Crohn's. He has not been on any maintenance therapy for this at all and without recurrence of disease on last colonoscopy. He is due for a surveillance colonoscopy in 2018 due to history of adenomas, so will evaluate the anastomosis at that time. He should follow up with any concerning abdominal symptoms in the interim.   History of diverticulitis s/p surgical resection - doing really well, no recurrence of symptoms, bowel habits stable, call if any  recurrence of symptoms.  History of colon adenomas - recall colonscopy due 10/2016  SCarolina Cellar MD [facility] Pager [phone]

## 2015-09-25 NOTE — Patient Instructions (Signed)
We have sent the following medications to your pharmacy for you to pick up at your convenience: Protonix 20 mg daily  You will be due for a recall colonoscopy in 2018. We will send you a reminder in the mail when it gets closer to that time.  If you are age 61 or older, your body mass index should be between 23-30. Your Body mass index is 27.41 kg/(m^2). If this is out of the aforementioned range listed, please consider follow up with your Primary Care Provider.  If you are age 67 or younger, your body mass index should be between 19-25. Your Body mass index is 27.41 kg/(m^2). If this is out of the aformentioned range listed, please consider follow up with your Primary Care Provider.

## 2015-10-04 DIAGNOSIS — M179 Osteoarthritis of knee, unspecified: Secondary | ICD-10-CM | POA: Diagnosis not present

## 2015-10-04 DIAGNOSIS — M961 Postlaminectomy syndrome, not elsewhere classified: Secondary | ICD-10-CM | POA: Diagnosis not present

## 2015-10-04 DIAGNOSIS — M47812 Spondylosis without myelopathy or radiculopathy, cervical region: Secondary | ICD-10-CM | POA: Diagnosis not present

## 2015-10-04 DIAGNOSIS — G894 Chronic pain syndrome: Secondary | ICD-10-CM | POA: Diagnosis not present

## 2015-11-01 DIAGNOSIS — M179 Osteoarthritis of knee, unspecified: Secondary | ICD-10-CM | POA: Diagnosis not present

## 2015-11-01 DIAGNOSIS — M47812 Spondylosis without myelopathy or radiculopathy, cervical region: Secondary | ICD-10-CM | POA: Diagnosis not present

## 2015-11-01 DIAGNOSIS — M961 Postlaminectomy syndrome, not elsewhere classified: Secondary | ICD-10-CM | POA: Diagnosis not present

## 2015-11-01 DIAGNOSIS — G894 Chronic pain syndrome: Secondary | ICD-10-CM | POA: Diagnosis not present

## 2015-12-04 DIAGNOSIS — M47812 Spondylosis without myelopathy or radiculopathy, cervical region: Secondary | ICD-10-CM | POA: Diagnosis not present

## 2015-12-04 DIAGNOSIS — G894 Chronic pain syndrome: Secondary | ICD-10-CM | POA: Diagnosis not present

## 2015-12-04 DIAGNOSIS — M179 Osteoarthritis of knee, unspecified: Secondary | ICD-10-CM | POA: Diagnosis not present

## 2015-12-04 DIAGNOSIS — M961 Postlaminectomy syndrome, not elsewhere classified: Secondary | ICD-10-CM | POA: Diagnosis not present

## 2016-02-03 DIAGNOSIS — G894 Chronic pain syndrome: Secondary | ICD-10-CM | POA: Diagnosis not present

## 2016-02-03 DIAGNOSIS — M47812 Spondylosis without myelopathy or radiculopathy, cervical region: Secondary | ICD-10-CM | POA: Diagnosis not present

## 2016-02-03 DIAGNOSIS — M961 Postlaminectomy syndrome, not elsewhere classified: Secondary | ICD-10-CM | POA: Diagnosis not present

## 2016-02-03 DIAGNOSIS — M179 Osteoarthritis of knee, unspecified: Secondary | ICD-10-CM | POA: Diagnosis not present

## 2016-03-04 DIAGNOSIS — M179 Osteoarthritis of knee, unspecified: Secondary | ICD-10-CM | POA: Diagnosis not present

## 2016-03-04 DIAGNOSIS — M47812 Spondylosis without myelopathy or radiculopathy, cervical region: Secondary | ICD-10-CM | POA: Diagnosis not present

## 2016-03-04 DIAGNOSIS — M961 Postlaminectomy syndrome, not elsewhere classified: Secondary | ICD-10-CM | POA: Diagnosis not present

## 2016-03-04 DIAGNOSIS — G894 Chronic pain syndrome: Secondary | ICD-10-CM | POA: Diagnosis not present

## 2016-03-16 DIAGNOSIS — J209 Acute bronchitis, unspecified: Secondary | ICD-10-CM | POA: Diagnosis not present

## 2016-03-16 DIAGNOSIS — J0140 Acute pansinusitis, unspecified: Secondary | ICD-10-CM | POA: Diagnosis not present

## 2016-03-16 DIAGNOSIS — I10 Essential (primary) hypertension: Secondary | ICD-10-CM | POA: Diagnosis not present

## 2016-04-03 DIAGNOSIS — M179 Osteoarthritis of knee, unspecified: Secondary | ICD-10-CM | POA: Diagnosis not present

## 2016-04-03 DIAGNOSIS — G894 Chronic pain syndrome: Secondary | ICD-10-CM | POA: Diagnosis not present

## 2016-04-03 DIAGNOSIS — M961 Postlaminectomy syndrome, not elsewhere classified: Secondary | ICD-10-CM | POA: Diagnosis not present

## 2016-04-03 DIAGNOSIS — M47812 Spondylosis without myelopathy or radiculopathy, cervical region: Secondary | ICD-10-CM | POA: Diagnosis not present

## 2016-05-04 DIAGNOSIS — G894 Chronic pain syndrome: Secondary | ICD-10-CM | POA: Diagnosis not present

## 2016-05-04 DIAGNOSIS — M961 Postlaminectomy syndrome, not elsewhere classified: Secondary | ICD-10-CM | POA: Diagnosis not present

## 2016-05-04 DIAGNOSIS — M179 Osteoarthritis of knee, unspecified: Secondary | ICD-10-CM | POA: Diagnosis not present

## 2016-05-04 DIAGNOSIS — M47812 Spondylosis without myelopathy or radiculopathy, cervical region: Secondary | ICD-10-CM | POA: Diagnosis not present

## 2016-06-03 DIAGNOSIS — Z79891 Long term (current) use of opiate analgesic: Secondary | ICD-10-CM | POA: Diagnosis not present

## 2016-06-03 DIAGNOSIS — F329 Major depressive disorder, single episode, unspecified: Secondary | ICD-10-CM | POA: Diagnosis not present

## 2016-06-03 DIAGNOSIS — G894 Chronic pain syndrome: Secondary | ICD-10-CM | POA: Diagnosis not present

## 2016-06-03 DIAGNOSIS — M179 Osteoarthritis of knee, unspecified: Secondary | ICD-10-CM | POA: Diagnosis not present

## 2016-06-03 DIAGNOSIS — M47812 Spondylosis without myelopathy or radiculopathy, cervical region: Secondary | ICD-10-CM | POA: Diagnosis not present

## 2016-06-03 DIAGNOSIS — G4700 Insomnia, unspecified: Secondary | ICD-10-CM | POA: Diagnosis not present

## 2016-06-03 DIAGNOSIS — M961 Postlaminectomy syndrome, not elsewhere classified: Secondary | ICD-10-CM | POA: Diagnosis not present

## 2016-06-09 ENCOUNTER — Other Ambulatory Visit: Payer: Self-pay

## 2016-06-09 ENCOUNTER — Telehealth: Payer: Self-pay | Admitting: Gastroenterology

## 2016-06-09 DIAGNOSIS — K50119 Crohn's disease of large intestine with unspecified complications: Secondary | ICD-10-CM

## 2016-06-09 DIAGNOSIS — R1033 Periumbilical pain: Secondary | ICD-10-CM

## 2016-06-09 NOTE — Telephone Encounter (Signed)
He has a history of both Crohn's disease and diverticulitis. We can obtain CBC, CMET, lipase, ESR, and CRP. If he has vomiting, unable to tolerate PO, or worsening of pain until his appointment he will need a CT scan of the abdomen and pelvis to further evaluate. Otherwise we can await his labs in the interim and will let him know results. thanks

## 2016-06-09 NOTE — Telephone Encounter (Signed)
Spoke to patient, he describes having peri-umbilical abdominal pain for last 7-10 days. States it is worse when he lays down. Tried taking Peptobismol, which did not help. He denies any fever, no changes in his bowel movements, no blood in his stool, no N/V. I have made him an appointment on 3/13 with [facility], which is first available. Please advise if you would like to order anything prior to his visit.

## 2016-06-09 NOTE — Telephone Encounter (Signed)
Labs ordered, patient will come get these done prior to appointment. He understands if sxs worsen he needs to call the office.

## 2016-06-09 NOTE — Telephone Encounter (Signed)
Error

## 2016-06-11 ENCOUNTER — Other Ambulatory Visit (INDEPENDENT_AMBULATORY_CARE_PROVIDER_SITE_OTHER): Payer: Medicare Other

## 2016-06-11 DIAGNOSIS — K50119 Crohn's disease of large intestine with unspecified complications: Secondary | ICD-10-CM | POA: Diagnosis not present

## 2016-06-11 DIAGNOSIS — Z125 Encounter for screening for malignant neoplasm of prostate: Secondary | ICD-10-CM | POA: Diagnosis not present

## 2016-06-11 DIAGNOSIS — R1033 Periumbilical pain: Secondary | ICD-10-CM | POA: Diagnosis not present

## 2016-06-11 DIAGNOSIS — R351 Nocturia: Secondary | ICD-10-CM | POA: Diagnosis not present

## 2016-06-11 DIAGNOSIS — R3912 Poor urinary stream: Secondary | ICD-10-CM | POA: Diagnosis not present

## 2016-06-11 LAB — COMPREHENSIVE METABOLIC PANEL
ALBUMIN: 4.5 g/dL (ref 3.5–5.2)
ALK PHOS: 58 U/L (ref 39–117)
ALT: 22 U/L (ref 0–53)
AST: 20 U/L (ref 0–37)
BUN: 9 mg/dL (ref 6–23)
CALCIUM: 9.6 mg/dL (ref 8.4–10.5)
CO2: 30 mEq/L (ref 19–32)
CREATININE: 0.98 mg/dL (ref 0.40–1.50)
Chloride: 104 mEq/L (ref 96–112)
GFR: 82.42 mL/min (ref >60.00)
Glucose, Bld: 153 mg/dL — ABNORMAL HIGH (ref 70–99)
Potassium: 3.9 mEq/L (ref 3.5–5.1)
SODIUM: 139 meq/L (ref 135–145)
TOTAL PROTEIN: 7.3 g/dL (ref 6.0–8.3)
Total Bilirubin: 0.5 mg/dL (ref 0.2–1.2)

## 2016-06-11 LAB — CBC WITH DIFFERENTIAL/PLATELET
BASOS PCT: 1 % (ref 0.0–3.0)
Basophils Absolute: 0.1 10*3/uL (ref 0.0–0.1)
EOS PCT: 2.9 % (ref 0.0–5.0)
Eosinophils Absolute: 0.3 10*3/uL (ref 0.0–0.7)
HEMATOCRIT: 45.4 % (ref 39.0–52.0)
HEMOGLOBIN: 15.2 g/dL (ref 13.0–17.0)
LYMPHS PCT: 29.8 % (ref 12.0–46.0)
Lymphs Abs: 3.5 10*3/uL (ref 0.7–4.0)
MCHC: 33.4 g/dL (ref 30.0–36.0)
MCV: 88.1 fl (ref 78.0–100.0)
MONO ABS: 0.8 10*3/uL (ref 0.1–1.0)
Monocytes Relative: 6.6 % (ref 3.0–12.0)
Neutro Abs: 7.1 10*3/uL (ref 1.4–7.7)
Neutrophils Relative %: 59.7 % (ref 43.0–77.0)
Platelets: 249 10*3/uL (ref 150.0–400.0)
RBC: 5.16 Mil/uL (ref 4.22–5.81)
RDW: 14.2 % (ref 11.5–15.5)
WBC: 11.9 10*3/uL — AB (ref 4.0–10.5)

## 2016-06-11 LAB — LIPASE: LIPASE: 23 U/L (ref 11.0–59.0)

## 2016-06-11 LAB — SEDIMENTATION RATE: SED RATE: 7 mm/h (ref 0–20)

## 2016-06-11 LAB — C-REACTIVE PROTEIN: CRP: 0.2 mg/dL — ABNORMAL LOW (ref 0.5–20.0)

## 2016-06-16 ENCOUNTER — Ambulatory Visit (INDEPENDENT_AMBULATORY_CARE_PROVIDER_SITE_OTHER): Payer: Medicare Other | Admitting: Gastroenterology

## 2016-06-16 ENCOUNTER — Encounter (INDEPENDENT_AMBULATORY_CARE_PROVIDER_SITE_OTHER): Payer: Self-pay

## 2016-06-16 ENCOUNTER — Encounter: Payer: Self-pay | Admitting: Gastroenterology

## 2016-06-16 VITALS — BP 120/74 | HR 84 | Ht 72.75 in | Wt 212.0 lb

## 2016-06-16 DIAGNOSIS — R1084 Generalized abdominal pain: Secondary | ICD-10-CM | POA: Diagnosis not present

## 2016-06-16 DIAGNOSIS — L290 Pruritus ani: Secondary | ICD-10-CM

## 2016-06-16 DIAGNOSIS — K219 Gastro-esophageal reflux disease without esophagitis: Secondary | ICD-10-CM | POA: Diagnosis not present

## 2016-06-16 MED ORDER — PANTOPRAZOLE SODIUM 20 MG PO TBEC: 20.0000 mg | DELAYED_RELEASE_TABLET | Freq: Every day | ORAL | 3 refills | Status: DC

## 2016-06-16 MED ORDER — HYOSCYAMINE SULFATE 0.125 MG SL SUBL: 0.1250 mg | SUBLINGUAL_TABLET | SUBLINGUAL | 1 refills | Status: DC | PRN

## 2016-06-16 NOTE — Progress Notes (Signed)
Agree with assessment and plan as outlined.  

## 2016-06-16 NOTE — Progress Notes (Signed)
06/16/2016 Micheal [name] [id number] 17-Jun-1955   HISTORY OF PRESENT ILLNESS:  This is a pleasant 61 year old male who is recently known to Dr. Havery Moros who took over his care after Dr. Nichola Sizer retirement. He has remote history of Crohn's disease as well as history of diverticulitis. He is not currently on any maintenance medicine for Crohn's disease. Please see Dr. Doyne Keel note from 09/25/2015 for extensive detail regarding the patient's history. He presents to our office today for evaluation of abdominal pain. He tells me that over the past couple of weeks has been experiencing mid to lower abdominal discomfort. He describes it as a nagging-type pain, usually worse when he is lying down. He says that it just enough sometimes to keep him from being able to fall back asleep after waking up to urinate, etc. He actually says it has gotten much better since he called to make this appointment. He denies any change in his bowel habits, fevers, chills, bleeding. He had also been concerned about some weight loss, but he says that he had some upper respiratory illness and had not been eating well with that. He says he has actually gained some weight back. His weight is only down 1 pound since his last visit here in June 2017. His last colonoscopy was with Dr. Olevia Perches in July 2013 at which time he was found have a prior right hemicolectomy, severe diverticulosis in the sigmoid colon, and 3 polyps that were removed, some were tubular adenomas so he is in for colonoscopy recall in 10/2016.  At that time ileal biopsies were negative for any changes consistent with IBD.  He also complains of occasional perianal itching/burning. Says that he had prior hemorrhoidectomy several years ago.  His reflux symptoms are well-controlled on Protonix 20 mg daily. He is asking for refills on that.   Past Medical History:  Diagnosis Date  . Adenomatous colon polyp   . Anxiety   . Arthritis   . At risk for sleep  apnea    STOP-BANG= 5    SENT TO PCP 01-31-2014  . BPH (benign prostatic hypertrophy)   . Chronic lower back pain   . Crohn's disease ([place])    no recurrence  . Diverticulosis of colon   . GERD (gastroesophageal reflux disease)   . Hepatic steatosis   . History of colon polyps    tubular adenoma  . History of diverticulitis of colon    sigmoid-  s/p resection  . History of gastric ulcer   . Inguinal hernia    Past Surgical History:  Procedure Laterality Date  . EXCISIONAL HEMORRHOIDECTOMY  age 58  . INCISIONAL HERNIA REPAIR  2000   and Left Inguinal Hernia repair  . KNEE ARTHROSCOPY Bilateral X4  last one  2011  . LAPAROSCOPIC PARTIAL COLECTOMY N/A 06/09/2012   Procedure: LAPAROSCOPIC assisted PARTIAL COLECTOMY open ;  Surgeon: Odis Hollingshead, MD;  Location: [facility];  Service: General;  Laterality: N/A;  diverticulitis  . LUMBAR DISC SURGERY  1994   L4 -- L5  . POPLITEAL SYNOVIAL CYST EXCISION Bilateral 2001  . TERMINAL ILEOCOLECTOMY  1993   and Appendectomy  for CROHN'S  . TONSILLECTOMY AND ADENOIDECTOMY  as child  . TRANSURETHRAL RESECTION OF PROSTATE N/A 02/05/2014   Procedure: TRANSURETHRAL RESECTION OF THE PROSTATE WITH GYRUS INSTRUMENTS;  Surgeon: Bernestine Amass, MD;  [facility];  Service: Urology;  Laterality: N/A;    reports that he quit smoking about 12 years ago.  His smoking use included Cigars. He has never used smokeless tobacco. He reports that he does not drink alcohol or use drugs. family history includes Cirrhosis in his mother; Heart disease in his father. Allergies  Allergen Reactions  . Codeine Itching      Outpatient Encounter Prescriptions as of 06/16/2016  Medication Sig  . gabapentin (NEURONTIN) 300 MG capsule 600 mg at bedtime.  Marland Kitchen L-Methylfolate (DEPLIN) 15 MG TABS Take 1 tablet by mouth daily before breakfast.  . oxyCODONE-acetaminophen (PERCOCET) 10-325 MG per tablet Take 1 tablet by mouth every 4 (four) hours as needed for  pain.   . pantoprazole (PROTONIX) 20 MG tablet Take 1 tablet (20 mg total) by mouth daily.  . Tamsulosin HCl (FLOMAX) 0.4 MG CAPS Take 0.8 mg by mouth at bedtime.   . [DISCONTINUED] pantoprazole (PROTONIX) 20 MG tablet Take 1 tablet (20 mg total) by mouth daily.  . hyoscyamine (LEVSIN SL) 0.125 MG SL tablet Place 1 tablet (0.125 mg total) under the tongue every 4 (four) hours as needed.  . [DISCONTINUED] ALPRAZolam (XANAX) 0.5 MG tablet Take 0.5 mg by mouth at bedtime as needed for sleep or anxiety.   . [DISCONTINUED] polyethylene glycol powder (MIRALAX) powder Take 1 Container by mouth daily as needed.   No facility-administered encounter medications on file as of 06/16/2016.      REVIEW OF SYSTEMS  : All other systems reviewed and negative except where noted in the History of Present Illness.   PHYSICAL EXAM: BP 120/74   Pulse 84   Ht 6' 0.75" (1.848 m)   Wt 212 lb (96.2 kg)   BMI 28.16 kg/m  General: Well developed white male in no acute distress Head: Normocephalic and atraumatic Eyes:  Sclerae anicteric, conjunctiva pink. Ears: Normal auditory acuity Lungs: Clear throughout to auscultation Heart: Regular rate and rhythm Abdomen: Soft, non-distended.  Normal bowel sounds.  Previous laparotomy scar noted and as well as ventral hernia.  Minimal lower abdominal TTP. Rectal:  No significant external abnormalities identified. May possibly have some perianal scar tissue from previous hemorrhoidectomy. Musculoskeletal: Symmetrical with no gross deformities  Skin: No lesions on visible extremities Extremities: No edema  Neurological: Alert oriented x 4, grossly non-focal Psychological:  Alert and cooperative. Normal mood and affect  ASSESSMENT AND PLAN: -Abdominal pain in mid to lower abdomen:  Pain has improved significantly. He does have remote history of Crohn's disease as well as history of diverticulitis. Recent labs including CRP and sedimentation rate were within normal limits.  Abdominal exam today was benign. Question if this could be related to scar tissue from previous surgeries. Would like to hold off on any other imaging for now. We will try Levsin 0.125 mg when necessary. He will call back if pain worsens or if he develops fevers, chills, etc at which time we can order CT scan. He is in for colonoscopy recall for July 2018 for history of colon polyps. -Perianal irritation:  No significant abnormalities seen on exam today.  Can continue Tucks pads, witch hazel, and can also use zinc oxide topically as needed. Avoid over aggressive hygiene. -GERD:  Well controlled on pantoprazole 20 mg daily.  Will refill.  CC:  Chesley Noon, MD

## 2016-06-16 NOTE — Patient Instructions (Addendum)
We have refilled your Protonix.   We have sent the following medications to your pharmacy for you to pick up at your convenience: Levsin 0.125 mg every 8 hrs as needed for pain/cramping   Call back and ask for Christian Mate, RN for recurrent/worsening symptoms.

## 2016-07-01 DIAGNOSIS — M961 Postlaminectomy syndrome, not elsewhere classified: Secondary | ICD-10-CM | POA: Diagnosis not present

## 2016-07-01 DIAGNOSIS — M179 Osteoarthritis of knee, unspecified: Secondary | ICD-10-CM | POA: Diagnosis not present

## 2016-07-01 DIAGNOSIS — G894 Chronic pain syndrome: Secondary | ICD-10-CM | POA: Diagnosis not present

## 2016-07-01 DIAGNOSIS — M47812 Spondylosis without myelopathy or radiculopathy, cervical region: Secondary | ICD-10-CM | POA: Diagnosis not present

## 2016-08-03 DIAGNOSIS — M179 Osteoarthritis of knee, unspecified: Secondary | ICD-10-CM | POA: Diagnosis not present

## 2016-08-03 DIAGNOSIS — G894 Chronic pain syndrome: Secondary | ICD-10-CM | POA: Diagnosis not present

## 2016-08-03 DIAGNOSIS — M47812 Spondylosis without myelopathy or radiculopathy, cervical region: Secondary | ICD-10-CM | POA: Diagnosis not present

## 2016-08-03 DIAGNOSIS — M961 Postlaminectomy syndrome, not elsewhere classified: Secondary | ICD-10-CM | POA: Diagnosis not present

## 2016-09-01 ENCOUNTER — Encounter: Payer: Self-pay | Admitting: Gastroenterology

## 2016-09-02 DIAGNOSIS — M179 Osteoarthritis of knee, unspecified: Secondary | ICD-10-CM | POA: Diagnosis not present

## 2016-09-02 DIAGNOSIS — M47812 Spondylosis without myelopathy or radiculopathy, cervical region: Secondary | ICD-10-CM | POA: Diagnosis not present

## 2016-09-02 DIAGNOSIS — G894 Chronic pain syndrome: Secondary | ICD-10-CM | POA: Diagnosis not present

## 2016-09-02 DIAGNOSIS — M961 Postlaminectomy syndrome, not elsewhere classified: Secondary | ICD-10-CM | POA: Diagnosis not present

## 2016-09-15 ENCOUNTER — Telehealth: Payer: Self-pay | Admitting: Gastroenterology

## 2016-09-15 NOTE — Telephone Encounter (Signed)
Spoke to patient, discussed that he is now due for his 5 year colonoscopy recall. Patient is scheduled for pre-visit and colonoscopy.

## 2016-10-02 ENCOUNTER — Ambulatory Visit (AMBULATORY_SURGERY_CENTER): Payer: Self-pay

## 2016-10-02 VITALS — Ht 73.0 in | Wt 205.0 lb

## 2016-10-02 DIAGNOSIS — M47812 Spondylosis without myelopathy or radiculopathy, cervical region: Secondary | ICD-10-CM | POA: Diagnosis not present

## 2016-10-02 DIAGNOSIS — Z860100 Personal history of colon polyps, unspecified: Secondary | ICD-10-CM

## 2016-10-02 DIAGNOSIS — M961 Postlaminectomy syndrome, not elsewhere classified: Secondary | ICD-10-CM | POA: Diagnosis not present

## 2016-10-02 DIAGNOSIS — M179 Osteoarthritis of knee, unspecified: Secondary | ICD-10-CM | POA: Diagnosis not present

## 2016-10-02 DIAGNOSIS — G894 Chronic pain syndrome: Secondary | ICD-10-CM | POA: Diagnosis not present

## 2016-10-02 DIAGNOSIS — K5000 Crohn's disease of small intestine without complications: Secondary | ICD-10-CM

## 2016-10-02 MED ORDER — SUPREP BOWEL PREP KIT 17.5-3.13-1.6 GM/177ML PO SOLN: 1.0000 | Freq: Once | ORAL | 0 refills | Status: AC

## 2016-10-02 NOTE — Progress Notes (Signed)
No diet meds No home oxygen No past problems with anesthesia No allergies to eggs or soy  Declined emmi

## 2016-10-09 ENCOUNTER — Ambulatory Visit (AMBULATORY_SURGERY_CENTER): Payer: Medicare Other | Admitting: Gastroenterology

## 2016-10-09 ENCOUNTER — Encounter: Payer: Self-pay | Admitting: Gastroenterology

## 2016-10-09 VITALS — BP 121/83 | HR 57 | Temp 98.9°F | Resp 15 | Ht 73.0 in | Wt 205.0 lb

## 2016-10-09 DIAGNOSIS — Z86010 Personal history of colonic polyps: Secondary | ICD-10-CM | POA: Diagnosis not present

## 2016-10-09 DIAGNOSIS — K5090 Crohn's disease, unspecified, without complications: Secondary | ICD-10-CM | POA: Diagnosis not present

## 2016-10-09 DIAGNOSIS — K219 Gastro-esophageal reflux disease without esophagitis: Secondary | ICD-10-CM | POA: Diagnosis not present

## 2016-10-09 DIAGNOSIS — D128 Benign neoplasm of rectum: Secondary | ICD-10-CM | POA: Diagnosis not present

## 2016-10-09 DIAGNOSIS — F419 Anxiety disorder, unspecified: Secondary | ICD-10-CM | POA: Diagnosis not present

## 2016-10-09 DIAGNOSIS — D122 Benign neoplasm of ascending colon: Secondary | ICD-10-CM | POA: Diagnosis not present

## 2016-10-09 DIAGNOSIS — D3A022 Benign carcinoid tumor of the ascending colon: Secondary | ICD-10-CM

## 2016-10-09 DIAGNOSIS — K5730 Diverticulosis of large intestine without perforation or abscess without bleeding: Secondary | ICD-10-CM | POA: Diagnosis not present

## 2016-10-09 DIAGNOSIS — K635 Polyp of colon: Secondary | ICD-10-CM

## 2016-10-09 DIAGNOSIS — D123 Benign neoplasm of transverse colon: Secondary | ICD-10-CM

## 2016-10-09 DIAGNOSIS — Z1211 Encounter for screening for malignant neoplasm of colon: Secondary | ICD-10-CM | POA: Diagnosis not present

## 2016-10-09 DIAGNOSIS — D125 Benign neoplasm of sigmoid colon: Secondary | ICD-10-CM

## 2016-10-09 MED ORDER — SODIUM CHLORIDE 0.9 % IV SOLN: 500.0000 mL | INTRAVENOUS | Status: DC

## 2016-10-09 NOTE — Progress Notes (Signed)
To PACU, vss patent aw report to rn

## 2016-10-09 NOTE — Op Note (Signed)
[place] Patient Name: Micheal [name] Procedure Date: 10/09/2016 11:19 AM MRN: [id number] Endoscopist: Remo Lipps P. Lilyauna Miedema MD, MD Age: 61 Referring MD:  Date of Birth: 1956-03-23 Gender: Male Account #: [id number] Procedure:                Colonoscopy Indications:              High risk colon cancer surveillance: Personal                            history of colonic polyps, reported history of                            remote resection of the right colon / ileum for                            Crohn's disease, not on any therapy for Crohn's Medicines:                Monitored Anesthesia Care Procedure:                Pre-Anesthesia Assessment:                           - Prior to the procedure, a History and Physical                            was performed, and patient medications and                            allergies were reviewed. The patient's tolerance of                            previous anesthesia was also reviewed. The risks                            and benefits of the procedure and the sedation                            options and risks were discussed with the patient.                            All questions were answered, and informed consent                            was obtained. Prior Anticoagulants: The patient has                            taken no previous anticoagulant or antiplatelet                            agents. ASA Grade Assessment: III - A patient with                            severe systemic disease. After reviewing the risks  and benefits, the patient was deemed in                            satisfactory condition to undergo the procedure.                           After obtaining informed consent, the colonoscope                            was passed under direct vision. Throughout the                            procedure, the patient's blood pressure, pulse, and                            oxygen  saturations were monitored continuously. The                            Colonoscope was introduced through the anus and                            advanced to the the ileocolonic anastomosis. The                            colonoscopy was performed without difficulty. The                            patient tolerated the procedure well. The quality                            of the bowel preparation was good. The terminal                            ileum and the rectum were photographed. Scope In: 11:40:08 AM Scope Out: 12:07:33 PM Scope Withdrawal Time: 0 hours 21 minutes 49 seconds  Total Procedure Duration: 0 hours 27 minutes 25 seconds  Findings:                 The perianal and digital rectal examinations were                            normal.                           The terminal ileum appeared normal.                           There was evidence of a prior end-to-end                            ileo-colonic anastomosis in the ascending colon.                            This was patent and was characterized by healthy  appearing mucosa. No evidence of Crohn's disease                           Two sessile polyps were found in the ascending                            colon. The polyps were 4 to 5 mm in size. These                            polyps were removed with a cold snare. Resection                            and retrieval were complete.                           Five sessile polyps were found in the transverse                            colon. The polyps were 5 to 10 mm in size. These                            polyps were removed with a cold snare. Resection                            and retrieval were complete.                           Two sessile polyps were found in the sigmoid colon.                            The polyps were 3 to 4 mm in size. These polyps                            were removed with a cold snare. One of the polyps                             was located at the anastomosis and the base of it                            appeared to be potentially within a diverticulum                            and hard to completely remove with cold snare - I                            suspect this could more likely represent                            granulation tissue than true adenoma. Biopsies were                            taken from the base  of it, I think it was                            completely removed, and placed into a separate                            bottle to ensure no adenomatous change. .                           A 8 mm polyp was found in the rectum. The polyp was                            semi-pedunculated. The polyp was removed with a hot                            snare. Resection and retrieval were complete.                           There was evidence of a prior end-to-end                            colo-colonic anastomosis in the sigmoid colon. This                            was patent and was characterized by healthy                            appearing mucosa.                           Scattered medium-mouthed diverticula were found in                            the transverse colon and left colon.                           Anal papilla(e) were hypertrophied.                           The exam was otherwise without abnormality. Complications:            No immediate complications. Estimated blood loss:                            Minimal. Estimated Blood Loss:     Estimated blood loss was minimal. Impression:               - The examined portion of the ileum was normal.                           - Patent end-to-end ileo-colonic anastomosis,                            characterized by healthy appearing mucosa.                           -  Two 4 to 5 mm polyps in the ascending colon,                            removed with a cold snare. Resected and retrieved.                           - Five 5 to 10  mm polyps in the transverse colon,                            removed with a cold snare. Resected and retrieved.                           - Two 3 to 4 mm polyps in the sigmoid colon,                            removed with a cold snare. Resected and retrieved.                            Base of one polyp biopsied as above.                           - One 8 mm polyp in the rectum, removed with a hot                            snare. Resected and retrieved.                           - Patent end-to-end colo-colonic anastomosis,                            characterized by healthy appearing mucosa.                           - Diverticulosis in the transverse colon and in the                            left colon.                           - Anal papilla(e) were hypertrophied, I think the                            likely cause of the patient's reported pruritus /                            perianal symptoms.                           - The examination was otherwise normal. Recommendation:           - Patient has a contact number available for                            emergencies. The signs and  symptoms of potential                            delayed complications were discussed with the                            patient. Return to normal activities tomorrow.                            Written discharge instructions were provided to the                            patient.                           - Resume previous diet.                           - Continue present medications.                           - Await pathology results.                           - Repeat colonoscopy is recommended for                            surveillance. The colonoscopy date will be                            determined after pathology results from today's                            exam become available for review.                           - No ibuprofen, naproxen, or other non-steroidal                             anti-inflammatory drugs for 2 weeks after polyp                            removal. Remo Lipps P. Ajmal Kathan MD, MD 10/09/2016 12:18:18 PM This report has been signed electronically.

## 2016-10-09 NOTE — Progress Notes (Signed)
Called to room to assist during endoscopic procedure.  Patient ID and intended procedure confirmed with present staff. Received instructions for my participation in the procedure from the performing physician.  

## 2016-10-09 NOTE — Patient Instructions (Signed)
Discharge instructions given. Handouts on polyps and diverticulosis. Resume previous medications. No ibuprofen, naproxen, or other NSAIDs for two weeks. YOU HAD AN ENDOSCOPIC PROCEDURE TODAY AT [facility]:   Refer to the procedure report that was given to you for any specific questions about what was found during the examination.  If the procedure report does not answer your questions, please call your gastroenterologist to clarify.  If you requested that your care partner not be given the details of your procedure findings, then the procedure report has been included in a sealed envelope for you to review at your convenience later.  YOU SHOULD EXPECT: Some feelings of bloating in the abdomen. Passage of more gas than usual.  Walking can help get rid of the air that was put into your GI tract during the procedure and reduce the bloating. If you had a lower endoscopy (such as a colonoscopy or flexible sigmoidoscopy) you may notice spotting of blood in your stool or on the toilet paper. If you underwent a bowel prep for your procedure, you may not have a normal bowel movement for a few days.  Please Note:  You might notice some irritation and congestion in your nose or some drainage.  This is from the oxygen used during your procedure.  There is no need for concern and it should clear up in a day or so.  SYMPTOMS TO REPORT IMMEDIATELY:   Following lower endoscopy (colonoscopy or flexible sigmoidoscopy):  Excessive amounts of blood in the stool  Significant tenderness or worsening of abdominal pains  Swelling of the abdomen that is new, acute  Fever of 100F or higher   For urgent or emergent issues, a gastroenterologist can be reached at any hour by calling [phone].   DIET:  We do recommend a small meal at first, but then you may proceed to your regular diet.  Drink plenty of fluids but you should avoid alcoholic beverages for 24 hours.  ACTIVITY:  You should plan to  take it easy for the rest of today and you should NOT DRIVE or use heavy machinery until tomorrow (because of the sedation medicines used during the test).    FOLLOW UP: Our staff will call the number listed on your records the next business day following your procedure to check on you and address any questions or concerns that you may have regarding the information given to you following your procedure. If we do not reach you, we will leave a message.  However, if you are feeling well and you are not experiencing any problems, there is no need to return our call.  We will assume that you have returned to your regular daily activities without incident.  If any biopsies were taken you will be contacted by phone or by letter within the next 1-3 weeks.  Please call us at [phone] if you have not heard about the biopsies in 3 weeks.    SIGNATURES/CONFIDENTIALITY: You and/or your care partner have signed paperwork which will be entered into your electronic medical record.  These signatures attest to the fact that that the information above on your After Visit Summary has been reviewed and is understood.  Full responsibility of the confidentiality of this discharge information lies with you and/or your care-partner.

## 2016-10-12 ENCOUNTER — Telehealth: Payer: Self-pay | Admitting: *Deleted

## 2016-10-12 NOTE — Telephone Encounter (Signed)
  Follow up Call-  Call back number 10/09/2016  Post procedure Call Back phone  # [phone]  Permission to leave phone message Yes  Some recent data might be hidden     Patient questions:  Do you have a fever, pain , or abdominal swelling? No. Pain Score  0 *  Have you tolerated food without any problems? Yes.    Have you been able to return to your normal activities? Yes.    Do you have any questions about your discharge instructions: Diet   No. Medications  No. Follow up visit  No.  Do you have questions or concerns about your Care? No.  Actions: * If pain score is 4 or above: No action needed, pain <4.

## 2016-10-15 ENCOUNTER — Encounter: Payer: Self-pay | Admitting: Gastroenterology

## 2016-11-02 DIAGNOSIS — M47812 Spondylosis without myelopathy or radiculopathy, cervical region: Secondary | ICD-10-CM | POA: Diagnosis not present

## 2016-11-02 DIAGNOSIS — M179 Osteoarthritis of knee, unspecified: Secondary | ICD-10-CM | POA: Diagnosis not present

## 2016-11-02 DIAGNOSIS — G894 Chronic pain syndrome: Secondary | ICD-10-CM | POA: Diagnosis not present

## 2016-11-02 DIAGNOSIS — M961 Postlaminectomy syndrome, not elsewhere classified: Secondary | ICD-10-CM | POA: Diagnosis not present

## 2016-12-03 DIAGNOSIS — Z79891 Long term (current) use of opiate analgesic: Secondary | ICD-10-CM | POA: Diagnosis not present

## 2016-12-03 DIAGNOSIS — G4700 Insomnia, unspecified: Secondary | ICD-10-CM | POA: Diagnosis not present

## 2016-12-03 DIAGNOSIS — M47812 Spondylosis without myelopathy or radiculopathy, cervical region: Secondary | ICD-10-CM | POA: Diagnosis not present

## 2016-12-03 DIAGNOSIS — F329 Major depressive disorder, single episode, unspecified: Secondary | ICD-10-CM | POA: Diagnosis not present

## 2016-12-03 DIAGNOSIS — M961 Postlaminectomy syndrome, not elsewhere classified: Secondary | ICD-10-CM | POA: Diagnosis not present

## 2016-12-03 DIAGNOSIS — G894 Chronic pain syndrome: Secondary | ICD-10-CM | POA: Diagnosis not present

## 2016-12-03 DIAGNOSIS — M179 Osteoarthritis of knee, unspecified: Secondary | ICD-10-CM | POA: Diagnosis not present

## 2017-01-01 DIAGNOSIS — M961 Postlaminectomy syndrome, not elsewhere classified: Secondary | ICD-10-CM | POA: Diagnosis not present

## 2017-01-01 DIAGNOSIS — G894 Chronic pain syndrome: Secondary | ICD-10-CM | POA: Diagnosis not present

## 2017-01-01 DIAGNOSIS — M47812 Spondylosis without myelopathy or radiculopathy, cervical region: Secondary | ICD-10-CM | POA: Diagnosis not present

## 2017-01-01 DIAGNOSIS — G4730 Sleep apnea, unspecified: Secondary | ICD-10-CM | POA: Diagnosis not present

## 2017-01-14 ENCOUNTER — Other Ambulatory Visit (HOSPITAL_BASED_OUTPATIENT_CLINIC_OR_DEPARTMENT_OTHER): Payer: Self-pay

## 2017-01-14 DIAGNOSIS — G4730 Sleep apnea, unspecified: Secondary | ICD-10-CM

## 2017-01-14 DIAGNOSIS — R0683 Snoring: Secondary | ICD-10-CM

## 2017-02-01 DIAGNOSIS — M961 Postlaminectomy syndrome, not elsewhere classified: Secondary | ICD-10-CM | POA: Diagnosis not present

## 2017-02-01 DIAGNOSIS — M47812 Spondylosis without myelopathy or radiculopathy, cervical region: Secondary | ICD-10-CM | POA: Diagnosis not present

## 2017-02-01 DIAGNOSIS — G4730 Sleep apnea, unspecified: Secondary | ICD-10-CM | POA: Diagnosis not present

## 2017-02-01 DIAGNOSIS — G894 Chronic pain syndrome: Secondary | ICD-10-CM | POA: Diagnosis not present

## 2017-02-06 ENCOUNTER — Ambulatory Visit (HOSPITAL_BASED_OUTPATIENT_CLINIC_OR_DEPARTMENT_OTHER): Payer: Medicare Other | Attending: Anesthesiology | Admitting: Internal Medicine

## 2017-02-06 DIAGNOSIS — G4733 Obstructive sleep apnea (adult) (pediatric): Secondary | ICD-10-CM | POA: Diagnosis not present

## 2017-02-06 DIAGNOSIS — R0683 Snoring: Secondary | ICD-10-CM

## 2017-02-06 DIAGNOSIS — G4730 Sleep apnea, unspecified: Secondary | ICD-10-CM

## 2017-02-14 DIAGNOSIS — G4733 Obstructive sleep apnea (adult) (pediatric): Secondary | ICD-10-CM

## 2017-02-14 NOTE — Procedures (Signed)
Patient Name: Micheal [name], Micheal [name] Date: 02/06/2017 Gender: Male D.O.B: 1956-03-06 Age (years): 61 Referring Provider: Nicholaus Bloom Height (inches): 74 Interpreting Physician: Baird Lyons MD, ABSM Weight (lbs): 210 RPSGT: Earney Hamburg BMI: 27 MRN: [id number] Neck Size: 18.00 CLINICAL INFORMATION Sleep Study Type: Split Night CPAP  Indication for sleep study: Snoring  Epworth Sleepiness Score: 3  SLEEP STUDY TECHNIQUE As per the AASM Manual for the Scoring of Sleep and Associated Events v2.3 (April 2016) with a hypopnea requiring 4% desaturations.  The channels recorded and monitored were frontal, central and occipital EEG, electrooculogram (EOG), submentalis EMG (chin), nasal and oral airflow, thoracic and abdominal wall motion, anterior tibialis EMG, snore microphone, electrocardiogram, and pulse oximetry. Continuous positive airway pressure (CPAP) was initiated when the patient met split night criteria and was titrated according to treat sleep-disordered breathing.  MEDICATIONS Medications self-administered by patient taken the night of the study : MELATONIN, GABAPENTIN, FLOMAX  RESPIRATORY PARAMETERS Diagnostic  Total AHI (/hr): 16.0 RDI (/hr): 18.5 OA Index (/hr): 10.7 CA Index (/hr): 0.0 REM AHI (/hr): 0.0 NREM AHI (/hr): 17.6 Supine AHI (/hr): 25.9 Non-supine AHI (/hr): 8.27 Min O2 Sat (%): 88.0 Mean O2 (%): 92.51 Time below 88% (min): 8.2   Titration  Optimal Pressure (cm): 15 AHI at Optimal Pressure (/hr): 3.0 Min O2 at Optimal Pressure (%): 90.0 Supine % at Optimal (%): 32 Sleep % at Optimal (%): 83    SLEEP ARCHITECTURE The recording time for the entire night was 427.0 minutes.  During a baseline period of 228.6 minutes, the patient slept for 168.5 minutes in REM and nonREM, yielding a sleep efficiency of 73.7%. Sleep onset after lights out was 14.8 minutes with a REM latency of 100.5 minutes. The patient spent 6.53% of the night in stage N1 sleep, 84.57% in  stage N2 sleep, 0.00% in stage N3 and 8.90% in REM.  During the titration period of 195.1 minutes, the patient slept for 180.7 minutes in REM and nonREM, yielding a sleep efficiency of 92.6%. Sleep onset after CPAP initiation was 4.4 minutes with a REM latency of 51.5 minutes. The patient spent 2.21% of the night in stage N1 sleep, 71.78% in stage N2 sleep, 0.00% in stage N3 and 26.00% in REM.  CARDIAC DATA The 2 lead EKG demonstrated sinus rhythm. The mean heart rate was 14.90 beats per minute. Other EKG findings include: None.  LEG MOVEMENT DATA The total Periodic Limb Movements of Sleep (PLMS) were 4. The PLMS index was 0.69 .  IMPRESSIONS - Moderate obstructive sleep apnea occurred during the diagnostic portion of the study(AHI = 16.0/hour). An optimal PAP pressure was selected for this patient ( 15 cm of water) - No significant central sleep apnea occurred during the diagnostic portion of the study (CAI = 0.0/hour). - Moderate oxygen desaturation to 88% was noted during the diagnostic portion of the study, but corrected at final CPAP. - The patient snored with loud snoring volume during the diagnostic portion of the study. - No cardiac abnormalities were noted during this study. - Clinically significant periodic limb movements did not occur during sleep.  DIAGNOSIS - Obstructive Sleep Apnea (327.23 [G47.33 ICD-10])  RECOMMENDATIONS - Trial of CPAP therapy on 15 cm H2O with a Medium size Fisher&Paykel Full Face Mask Simplus mask and heated humidification. - Be careful with alcohol, sedatives and other CNS depressants that may worsen sleep apnea and disrupt normal sleep architecture. - Sleep hygiene should be reviewed to assess factors that may improve sleep quality. - Weight management  and regular exercise should be initiated or continued.  [Electronically signed] 02/14/2017 08:50 AM  Baird Lyons MD, [place], American Board of Sleep Medicine   NPI: [id number]

## 2017-04-02 DIAGNOSIS — M47812 Spondylosis without myelopathy or radiculopathy, cervical region: Secondary | ICD-10-CM | POA: Diagnosis not present

## 2017-04-02 DIAGNOSIS — M961 Postlaminectomy syndrome, not elsewhere classified: Secondary | ICD-10-CM | POA: Diagnosis not present

## 2017-04-02 DIAGNOSIS — G4730 Sleep apnea, unspecified: Secondary | ICD-10-CM | POA: Diagnosis not present

## 2017-04-02 DIAGNOSIS — G894 Chronic pain syndrome: Secondary | ICD-10-CM | POA: Diagnosis not present

## 2017-05-03 DIAGNOSIS — M961 Postlaminectomy syndrome, not elsewhere classified: Secondary | ICD-10-CM | POA: Diagnosis not present

## 2017-05-03 DIAGNOSIS — G4730 Sleep apnea, unspecified: Secondary | ICD-10-CM | POA: Diagnosis not present

## 2017-05-03 DIAGNOSIS — M47812 Spondylosis without myelopathy or radiculopathy, cervical region: Secondary | ICD-10-CM | POA: Diagnosis not present

## 2017-05-03 DIAGNOSIS — G894 Chronic pain syndrome: Secondary | ICD-10-CM | POA: Diagnosis not present

## 2017-06-03 DIAGNOSIS — G894 Chronic pain syndrome: Secondary | ICD-10-CM | POA: Diagnosis not present

## 2017-06-03 DIAGNOSIS — G4730 Sleep apnea, unspecified: Secondary | ICD-10-CM | POA: Diagnosis not present

## 2017-06-03 DIAGNOSIS — M47812 Spondylosis without myelopathy or radiculopathy, cervical region: Secondary | ICD-10-CM | POA: Diagnosis not present

## 2017-06-03 DIAGNOSIS — M961 Postlaminectomy syndrome, not elsewhere classified: Secondary | ICD-10-CM | POA: Diagnosis not present

## 2017-06-04 ENCOUNTER — Ambulatory Visit (INDEPENDENT_AMBULATORY_CARE_PROVIDER_SITE_OTHER): Payer: Medicare Other | Admitting: Pulmonary Disease

## 2017-06-04 ENCOUNTER — Encounter: Payer: Self-pay | Admitting: Pulmonary Disease

## 2017-06-04 VITALS — BP 124/78 | HR 72 | Ht 74.0 in | Wt 210.0 lb

## 2017-06-04 DIAGNOSIS — Z7189 Other specified counseling: Secondary | ICD-10-CM | POA: Diagnosis not present

## 2017-06-04 DIAGNOSIS — G4733 Obstructive sleep apnea (adult) (pediatric): Secondary | ICD-10-CM | POA: Diagnosis not present

## 2017-06-04 NOTE — Progress Notes (Signed)
[facility]  Chief Complaint  Patient presents with  . sleep consult    Pt referred from Dr. Greta Doom MD. Pt wakes himself up for gasping for air, snores loudly.     Vital signs: BP 124/78 (BP Location: Left Arm, Cuff Size: Normal)   Pulse 72   Ht 6' 2"  (1.88 m)   Wt 210 lb (95.3 kg)   SpO2 96%   BMI 26.96 kg/m   History of Present Illness: Micheal [name] is a 62 y.o. male for evaluation of sleep problems.  He has been worried about his sleep for a while.  He snores, and stops breathing while asleep.  He will wake up feeling like he is choked.  He never dreams  He has trouble sleeping on his back.  He goes to sleep at 11 pm.  He falls asleep in 10 minutes.  He wakes up some times to use the bathroom.  He gets out of bed at 8 am.  He feels tired in the morning.  He denies morning headache.  He does not use anything to help him fall sleep or stay awake.  He denies sleep walking, sleep talking, bruxism, or nightmares.  There is no history of restless legs.  He denies sleep hallucinations, sleep paralysis, or cataplexy.  The Epworth score is 5 out of 24.  He had sleep study that showed moderate sleep apnea.  He has not been set up on therapy yet.  Physical Exam:  General - pleasant Eyes - pupils reactive ENT - no sinus tenderness, no oral exudate, no LAN, MP 4, scalloped tongue Cardiac - regular, no murmur Chest - no wheeze, rales Abd - soft, non tender Ext - no edema Skin - no rashes Neuro - normal strength Psych - normal mood  Discussion: He has snoring, sleep disruption, apnea,and daytime sleepiness.  His sleep study showed moderate sleep apnea.  We discussed how sleep apnea can affect various health problems, including risks for hypertension, cardiovascular disease, and diabetes.  We also discussed how sleep disruption can increase risks for accidents, such as while driving.  Weight loss as a means of improving sleep apnea was also  reviewed.  Additional treatment options discussed were CPAP therapy, oral appliance, and surgical intervention.  Assessment/Plan:  Obstructive sleep apnea. - will arrange for auto CPAP set up   Patient Instructions  Will arrange for CPAP set up  Follow up in 2 months after CPAP set up    Chesley Mires, MD [place] 06/04/2017, 3:11 PM Pager:  [phone]  Flow Sheet  Sleep tests: PSG 02/06/17 >> AHI 16, SpO2 low 88%  Review of Systems: Constitutional: Negative for fever and unexpected weight change.  HENT: Positive for dental problem, sinus pressure and sneezing. Negative for congestion, ear pain, nosebleeds, postnasal drip, rhinorrhea, sore throat and trouble swallowing.   Eyes: Negative for redness and itching.  Respiratory: Negative for cough, chest tightness, shortness of breath and wheezing.   Cardiovascular: Negative for palpitations and leg swelling.  Gastrointestinal: Negative for nausea and vomiting.  Genitourinary: Negative for dysuria.  Musculoskeletal: Negative for joint swelling.  Skin: Negative for rash.  Allergic/Immunologic: Positive for environmental allergies. Negative for food allergies and immunocompromised state.  Neurological: Negative for headaches.  Hematological: Does not bruise/bleed easily.  Psychiatric/Behavioral: Negative for dysphoric mood. The patient is nervous/anxious.    Past Medical History: He  has a past medical history of Adenomatous colon polyp, Anal fissure, Anxiety, Arthritis, At risk  for sleep apnea, BPH (benign prostatic hypertrophy), Chronic lower back pain, Crohn's disease ([place]), Diverticulosis of colon, GERD (gastroesophageal reflux disease), Hepatic steatosis, History of colon polyps, History of diverticulitis of colon, History of gastric ulcer, and Inguinal hernia.  Past Surgical History: He  has a past surgical history that includes Incisional hernia repair (2000); Excisional hemorrhoidectomy (age 34);  Laparoscopic partial colectomy (N/A, 06/09/2012); TERMINAL ILEOCOLECTOMY (1993); Lumbar disc surgery (1994); Knee arthroscopy (Bilateral, X4  last one  2011); Tonsillectomy and adenoidectomy (as child); Popliteal synovial cyst excision (Bilateral, 2001); and Transurethral resection of prostate (N/A, 02/05/2014).  Family History: His family history includes Cirrhosis in his mother; Heart disease in his father.  Social History: He  reports that he quit smoking about 13 years ago. His smoking use included cigars. he has never used smokeless tobacco. He reports that he does not drink alcohol or use drugs.  Medications: Allergies as of 06/04/2017      Reactions   Codeine Itching      Medication List        Accurate as of 06/04/17  3:11 PM. Always use your most recent med list.          gabapentin 300 MG capsule Commonly known as:  NEURONTIN 600 mg at bedtime.   oxyCODONE-acetaminophen 10-325 MG tablet Commonly known as:  PERCOCET Take 1 tablet by mouth every 4 (four) hours as needed for pain.   pantoprazole 20 MG tablet Commonly known as:  PROTONIX Take 1 tablet (20 mg total) by mouth daily.   tamsulosin 0.4 MG Caps capsule Commonly known as:  FLOMAX Take 0.8 mg by mouth at bedtime.

## 2017-06-04 NOTE — Patient Instructions (Signed)
Will arrange for CPAP set up  Follow up in 2 months after CPAP set up

## 2017-06-04 NOTE — Progress Notes (Signed)
   Subjective:    Patient ID: Micheal [name], male    DOB: May 01, 1955, 62 y.o.   MRN: [id number]  HPI    Review of Systems  Constitutional: Negative for fever and unexpected weight change.  HENT: Positive for dental problem, sinus pressure and sneezing. Negative for congestion, ear pain, nosebleeds, postnasal drip, rhinorrhea, sore throat and trouble swallowing.   Eyes: Negative for redness and itching.  Respiratory: Negative for cough, chest tightness, shortness of breath and wheezing.   Cardiovascular: Negative for palpitations and leg swelling.  Gastrointestinal: Negative for nausea and vomiting.  Genitourinary: Negative for dysuria.  Musculoskeletal: Negative for joint swelling.  Skin: Negative for rash.  Allergic/Immunologic: Positive for environmental allergies. Negative for food allergies and immunocompromised state.  Neurological: Negative for headaches.  Hematological: Does not bruise/bleed easily.  Psychiatric/Behavioral: Negative for dysphoric mood. The patient is nervous/anxious.        Objective:   Physical Exam        Assessment & Plan:

## 2017-06-09 ENCOUNTER — Telehealth: Payer: Self-pay | Admitting: Pulmonary Disease

## 2017-06-09 DIAGNOSIS — G4733 Obstructive sleep apnea (adult) (pediatric): Secondary | ICD-10-CM

## 2017-06-09 NOTE — Telephone Encounter (Signed)
Called and spoke with Danae Chen she states that because of the patients insurance they are needing a copy of the face to face OV where the patient was first sent for the sleep study that was done on 11.7.18. Advised her that the patient was seen for the first time in our office on 3.1.19. She is referring to Dr. Anastasia Pall for this note and will contact us if she needs anything else. Nothing further needed.

## 2017-06-09 NOTE — Telephone Encounter (Signed)
Micheal [name] from [place] is calling [phone]

## 2017-06-09 NOTE — Telephone Encounter (Signed)
Okay to send order to change DME companies.

## 2017-06-09 NOTE — Telephone Encounter (Signed)
Pt requesting to switch cpap to [facility].  States that he is dissatisfied with [facility] care.   VS please advise if ok to switch homecare companies.  Thanks!

## 2017-06-09 NOTE — Telephone Encounter (Signed)
Order placed to [facility]. lmtcb X1 for pt- need to make pt aware that cpap order has been placed.

## 2017-07-02 DIAGNOSIS — G894 Chronic pain syndrome: Secondary | ICD-10-CM | POA: Diagnosis not present

## 2017-07-02 DIAGNOSIS — G4730 Sleep apnea, unspecified: Secondary | ICD-10-CM | POA: Diagnosis not present

## 2017-07-02 DIAGNOSIS — M47812 Spondylosis without myelopathy or radiculopathy, cervical region: Secondary | ICD-10-CM | POA: Diagnosis not present

## 2017-07-02 DIAGNOSIS — M961 Postlaminectomy syndrome, not elsewhere classified: Secondary | ICD-10-CM | POA: Diagnosis not present

## 2017-08-02 DIAGNOSIS — M47812 Spondylosis without myelopathy or radiculopathy, cervical region: Secondary | ICD-10-CM | POA: Diagnosis not present

## 2017-08-02 DIAGNOSIS — M961 Postlaminectomy syndrome, not elsewhere classified: Secondary | ICD-10-CM | POA: Diagnosis not present

## 2017-08-02 DIAGNOSIS — G4730 Sleep apnea, unspecified: Secondary | ICD-10-CM | POA: Diagnosis not present

## 2017-08-02 DIAGNOSIS — G894 Chronic pain syndrome: Secondary | ICD-10-CM | POA: Diagnosis not present

## 2017-08-04 ENCOUNTER — Ambulatory Visit: Payer: Medicare Other | Admitting: Pulmonary Disease

## 2017-08-30 DIAGNOSIS — Z79891 Long term (current) use of opiate analgesic: Secondary | ICD-10-CM | POA: Diagnosis not present

## 2017-08-31 DIAGNOSIS — M47812 Spondylosis without myelopathy or radiculopathy, cervical region: Secondary | ICD-10-CM | POA: Diagnosis not present

## 2017-08-31 DIAGNOSIS — G4730 Sleep apnea, unspecified: Secondary | ICD-10-CM | POA: Diagnosis not present

## 2017-08-31 DIAGNOSIS — M961 Postlaminectomy syndrome, not elsewhere classified: Secondary | ICD-10-CM | POA: Diagnosis not present

## 2017-08-31 DIAGNOSIS — G894 Chronic pain syndrome: Secondary | ICD-10-CM | POA: Diagnosis not present

## 2017-09-11 ENCOUNTER — Other Ambulatory Visit: Payer: Self-pay | Admitting: Gastroenterology

## 2017-10-01 DIAGNOSIS — G894 Chronic pain syndrome: Secondary | ICD-10-CM | POA: Diagnosis not present

## 2017-10-01 DIAGNOSIS — G4730 Sleep apnea, unspecified: Secondary | ICD-10-CM | POA: Diagnosis not present

## 2017-10-01 DIAGNOSIS — M961 Postlaminectomy syndrome, not elsewhere classified: Secondary | ICD-10-CM | POA: Diagnosis not present

## 2017-10-01 DIAGNOSIS — M47812 Spondylosis without myelopathy or radiculopathy, cervical region: Secondary | ICD-10-CM | POA: Diagnosis not present

## 2017-10-04 DIAGNOSIS — M25561 Pain in right knee: Secondary | ICD-10-CM | POA: Diagnosis not present

## 2017-10-04 DIAGNOSIS — M1711 Unilateral primary osteoarthritis, right knee: Secondary | ICD-10-CM | POA: Diagnosis not present

## 2017-10-27 DIAGNOSIS — M546 Pain in thoracic spine: Secondary | ICD-10-CM | POA: Diagnosis not present

## 2017-11-01 DIAGNOSIS — M961 Postlaminectomy syndrome, not elsewhere classified: Secondary | ICD-10-CM | POA: Diagnosis not present

## 2017-11-01 DIAGNOSIS — M47812 Spondylosis without myelopathy or radiculopathy, cervical region: Secondary | ICD-10-CM | POA: Diagnosis not present

## 2017-11-01 DIAGNOSIS — G894 Chronic pain syndrome: Secondary | ICD-10-CM | POA: Diagnosis not present

## 2017-11-01 DIAGNOSIS — G4730 Sleep apnea, unspecified: Secondary | ICD-10-CM | POA: Diagnosis not present

## 2017-11-09 DIAGNOSIS — N3943 Post-void dribbling: Secondary | ICD-10-CM | POA: Diagnosis not present

## 2017-11-09 DIAGNOSIS — R351 Nocturia: Secondary | ICD-10-CM | POA: Diagnosis not present

## 2017-11-09 DIAGNOSIS — Z125 Encounter for screening for malignant neoplasm of prostate: Secondary | ICD-10-CM | POA: Diagnosis not present

## 2017-11-09 DIAGNOSIS — N401 Enlarged prostate with lower urinary tract symptoms: Secondary | ICD-10-CM | POA: Diagnosis not present

## 2017-12-02 DIAGNOSIS — G4730 Sleep apnea, unspecified: Secondary | ICD-10-CM | POA: Diagnosis not present

## 2017-12-02 DIAGNOSIS — G894 Chronic pain syndrome: Secondary | ICD-10-CM | POA: Diagnosis not present

## 2017-12-02 DIAGNOSIS — M961 Postlaminectomy syndrome, not elsewhere classified: Secondary | ICD-10-CM | POA: Diagnosis not present

## 2017-12-02 DIAGNOSIS — M47812 Spondylosis without myelopathy or radiculopathy, cervical region: Secondary | ICD-10-CM | POA: Diagnosis not present

## 2017-12-14 ENCOUNTER — Other Ambulatory Visit: Payer: Self-pay | Admitting: Gastroenterology

## 2017-12-31 DIAGNOSIS — M179 Osteoarthritis of knee, unspecified: Secondary | ICD-10-CM | POA: Diagnosis not present

## 2017-12-31 DIAGNOSIS — M47812 Spondylosis without myelopathy or radiculopathy, cervical region: Secondary | ICD-10-CM | POA: Diagnosis not present

## 2017-12-31 DIAGNOSIS — G894 Chronic pain syndrome: Secondary | ICD-10-CM | POA: Diagnosis not present

## 2017-12-31 DIAGNOSIS — M961 Postlaminectomy syndrome, not elsewhere classified: Secondary | ICD-10-CM | POA: Diagnosis not present

## 2018-01-28 DIAGNOSIS — M961 Postlaminectomy syndrome, not elsewhere classified: Secondary | ICD-10-CM | POA: Diagnosis not present

## 2018-01-28 DIAGNOSIS — G894 Chronic pain syndrome: Secondary | ICD-10-CM | POA: Diagnosis not present

## 2018-01-28 DIAGNOSIS — M47812 Spondylosis without myelopathy or radiculopathy, cervical region: Secondary | ICD-10-CM | POA: Diagnosis not present

## 2018-01-28 DIAGNOSIS — M179 Osteoarthritis of knee, unspecified: Secondary | ICD-10-CM | POA: Diagnosis not present

## 2018-02-17 DIAGNOSIS — M1711 Unilateral primary osteoarthritis, right knee: Secondary | ICD-10-CM | POA: Diagnosis not present

## 2018-02-28 DIAGNOSIS — M47812 Spondylosis without myelopathy or radiculopathy, cervical region: Secondary | ICD-10-CM | POA: Diagnosis not present

## 2018-02-28 DIAGNOSIS — M961 Postlaminectomy syndrome, not elsewhere classified: Secondary | ICD-10-CM | POA: Diagnosis not present

## 2018-02-28 DIAGNOSIS — M179 Osteoarthritis of knee, unspecified: Secondary | ICD-10-CM | POA: Diagnosis not present

## 2018-02-28 DIAGNOSIS — G894 Chronic pain syndrome: Secondary | ICD-10-CM | POA: Diagnosis not present

## 2018-03-06 ENCOUNTER — Other Ambulatory Visit: Payer: Self-pay | Admitting: Gastroenterology

## 2018-03-09 ENCOUNTER — Telehealth: Payer: Self-pay | Admitting: Gastroenterology

## 2018-03-09 MED ORDER — PANTOPRAZOLE SODIUM 20 MG PO TBEC: DELAYED_RELEASE_TABLET | ORAL | 0 refills | Status: DC

## 2018-03-09 NOTE — Telephone Encounter (Signed)
30 day supply sent to [facility]

## 2018-03-29 DIAGNOSIS — M961 Postlaminectomy syndrome, not elsewhere classified: Secondary | ICD-10-CM | POA: Diagnosis not present

## 2018-03-29 DIAGNOSIS — M179 Osteoarthritis of knee, unspecified: Secondary | ICD-10-CM | POA: Diagnosis not present

## 2018-03-29 DIAGNOSIS — M47812 Spondylosis without myelopathy or radiculopathy, cervical region: Secondary | ICD-10-CM | POA: Diagnosis not present

## 2018-03-29 DIAGNOSIS — G894 Chronic pain syndrome: Secondary | ICD-10-CM | POA: Diagnosis not present

## 2018-04-07 ENCOUNTER — Encounter: Payer: Self-pay | Admitting: Gastroenterology

## 2018-04-07 ENCOUNTER — Ambulatory Visit: Payer: Medicare Other | Admitting: Gastroenterology

## 2018-04-07 ENCOUNTER — Encounter (INDEPENDENT_AMBULATORY_CARE_PROVIDER_SITE_OTHER): Payer: Self-pay

## 2018-04-07 VITALS — BP 124/80 | HR 76 | Ht 72.75 in | Wt 214.0 lb

## 2018-04-07 DIAGNOSIS — K219 Gastro-esophageal reflux disease without esophagitis: Secondary | ICD-10-CM | POA: Diagnosis not present

## 2018-04-07 DIAGNOSIS — Z860100 Personal history of colon polyps, unspecified: Secondary | ICD-10-CM

## 2018-04-07 DIAGNOSIS — R1013 Epigastric pain: Secondary | ICD-10-CM | POA: Diagnosis not present

## 2018-04-07 DIAGNOSIS — K50819 Crohn's disease of both small and large intestine with unspecified complications: Secondary | ICD-10-CM

## 2018-04-07 MED ORDER — PANTOPRAZOLE SODIUM 20 MG PO TBEC: DELAYED_RELEASE_TABLET | ORAL | 3 refills | Status: DC

## 2018-04-07 NOTE — Progress Notes (Signed)
HPI :  63 y/o male here for a follow up visit.   Crohn's history: Diagnosed with Crohns disease when he had ileocolonic resection in the 1990s or so for abdominal pain. Over time he reported being on a sulfasalazine for a period of time, but mostly not on any maintenance therapy. He has not had any overt recurrence of disease. He has not been on any maintenance therapy for Crohn's. Colonoscopy in 2013 and in 2018 in which there was no evidence of active Crohns at the surgical anastomosis. He has had severe diverticulosis leading to resection of that in the past.   He denies any problems with abdominal pain. He has no change in bowel habits, is very regular with his bowels. He eats a high-fiber diet in light of his chronic Percocet used keeps his stools regular. He denies any blood in the stools. Generally doing very well in this regard. His last colonoscopy showed no evidence of Crohn's disease. He was found to have multiple adenomas, due for surveillance colonoscopy in July 2021.  He is otherwise been on pantoprazole for several years for history of reflux, also endorses being told he had ulcers in his stomach although has never had a prior endoscopy. He states if he does not take his Protonix he will have quick recurrence of symptoms. He is currently taking 20 mg a day. This controls his symptoms of reflux and dyspepsia quite well. He denies any dysphagia. No nausea or vomiting. No abdominal pain. No family history of esophageal cancer.   Colonoscopy 10/09/2016 - normal ileum, patent anastomosis in right colon, 10 polyps removed, most c/w adenomas - repeat in 3 years  Past Medical History:  Diagnosis Date  . Adenomatous colon polyp   . Anal fissure    pt believes it is 2 inches up  . Anxiety   . Arthritis   . At risk for sleep apnea    STOP-BANG= 5    SENT TO PCP 01-31-2014  . BPH (benign prostatic hypertrophy)   . Chronic lower back pain   . Crohn's disease ([place])    no recurrence  .  Diverticulosis of colon   . GERD (gastroesophageal reflux disease)   . Hepatic steatosis   . History of colon polyps    tubular adenoma  . History of diverticulitis of colon    sigmoid-  s/p resection  . History of gastric ulcer   . Inguinal hernia      Past Surgical History:  Procedure Laterality Date  . EXCISIONAL HEMORRHOIDECTOMY  age 72  . INCISIONAL HERNIA REPAIR  2000   and Left Inguinal Hernia repair  . KNEE ARTHROSCOPY Bilateral X4  last one  2011  . LAPAROSCOPIC PARTIAL COLECTOMY N/A 06/09/2012   Procedure: LAPAROSCOPIC assisted PARTIAL COLECTOMY open ;  Surgeon: Odis Hollingshead, MD;  Location: [facility];  Service: General;  Laterality: N/A;  diverticulitis  . LUMBAR DISC SURGERY  1994   L4 -- L5  . POPLITEAL SYNOVIAL CYST EXCISION Bilateral 2001  . TERMINAL ILEOCOLECTOMY  1993   and Appendectomy  for CROHN'S  . TONSILLECTOMY AND ADENOIDECTOMY  as child  . TRANSURETHRAL RESECTION OF PROSTATE N/A 02/05/2014   Procedure: TRANSURETHRAL RESECTION OF THE PROSTATE WITH GYRUS INSTRUMENTS;  Surgeon: Bernestine Amass, MD;  Location: [facility];  Service: Urology;  Laterality: N/A;   Family History  Problem Relation Age of Onset  . Heart disease Father   . Cirrhosis Mother  alcohol induced  . Colon cancer Neg Hx    Social History   Tobacco Use  . Smoking status: Former Smoker    Types: Cigars    Last attempt to quit: 02/02/2004    Years since quitting: 14.1  . Smokeless tobacco: Never Used  Substance Use Topics  . Alcohol use: No  . Drug use: No   Current Outpatient Medications  Medication Sig Dispense Refill  . FLUoxetine (PROZAC) 10 MG tablet Take 20 mg by mouth at bedtime.    . gabapentin (NEURONTIN) 300 MG capsule 600 mg at bedtime.  1  . oxyCODONE-acetaminophen (PERCOCET) 10-325 MG per tablet Take 1 tablet by mouth every 4 (four) hours as needed for pain.     . pantoprazole (PROTONIX) 20 MG tablet TAKE 1 TABLET(20 MG) BY MOUTH DAILY 30 tablet 0   . Tamsulosin HCl (FLOMAX) 0.4 MG CAPS Take 0.8 mg by mouth at bedtime.      Current Facility-Administered Medications  Medication Dose Route Frequency Provider Last Rate Last Dose  . 0.9 %  sodium chloride infusion  500 mL Intravenous Continuous Armbruster, Carlota Raspberry, MD       Allergies  Allergen Reactions  . Codeine Itching     Review of Systems: All systems reviewed and negative except where noted in HPI.   Lab Results  Component Value Date   CREATININE 0.98 06/11/2016   BUN 9 06/11/2016   NA 139 06/11/2016   K 3.9 06/11/2016   CL 104 06/11/2016   CO2 30 06/11/2016   Lab Results  Component Value Date   ALT 22 06/11/2016   AST 20 06/11/2016   ALKPHOS 58 06/11/2016   BILITOT 0.5 06/11/2016    Lab Results  Component Value Date   CREATININE 0.98 06/11/2016   BUN 9 06/11/2016   NA 139 06/11/2016   K 3.9 06/11/2016   CL 104 06/11/2016   CO2 30 06/11/2016     Physical Exam: BP 124/80 (BP Location: Left Arm, Patient Position: Sitting, Cuff Size: Normal)   Pulse 76   Ht 6' 0.75" (1.848 m)   Wt 214 lb (97.1 kg)   BMI 28.43 kg/m  Constitutional: Pleasant,well-developed, male in no acute distress. HEENT: Normocephalic and atraumatic. Conjunctivae are normal. No scleral icterus. Neck supple.  Cardiovascular: Normal rate, regular rhythm.  Pulmonary/chest: Effort normal and breath sounds normal. No wheezing, rales or rhonchi. Abdominal: Soft, nondistended, nontender. There are no masses palpable. No hepatomegaly. Extremities: no edema Lymphadenopathy: No cervical adenopathy noted. Neurological: Alert and oriented to person place and time. Skin: Skin is warm and dry. No rashes noted. Psychiatric: Normal mood and affect. Behavior is normal.   ASSESSMENT AND PLAN: 63 year old male here for reassessment following issues:  GERD / dyspepsia - on Protonix 20 mg a day with good control symptoms. He has quick recurrence of symptoms if he is stopped in the past. He is  asking to continue the medication. I discussed long-term associated risks of chronic PPI use. He understands the risks and wishes to proceed with therapy. I otherwise recommend an upper endoscopy for screening for Barrett's esophagus given his age and long-standing use of PPI. I discussed with EGD is including risks and benefits. He wanted to think about this for a bit, he reports he will call us back to schedule when he is ready. He agreed with the plan  Crohn's disease / history of colon adenomas - as above, one prior surgical resection without any recurrence of Crohn's on last few  colonoscopies. Due for surveillance colonoscopy in 2021, otherwise will contact me with development of any symptoms moving forward.   [place]ack[place] Cellar, MD [facility]

## 2018-04-07 NOTE — Patient Instructions (Addendum)
If you are age 64 or older, your body mass index should be between 23-30. Your Body mass index is 28.43 kg/m. If this is out of the aforementioned range listed, please consider follow up with your Primary Care Provider.  If you are age 59 or younger, your body mass index should be between 19-25. Your Body mass index is 28.43 kg/m. If this is out of the aformentioned range listed, please consider follow up with your Primary Care Provider.   We have sent the following medications to your pharmacy for you to pick up at your convenience: Protonix 65m: take once a day.  Thank you for entrusting me with your care and [facility] SC[facility]ina Cellar

## 2018-04-29 ENCOUNTER — Telehealth: Payer: Self-pay | Admitting: Gastroenterology

## 2018-04-29 NOTE — Telephone Encounter (Signed)
Patient states he has been experiencing abd pain and dark stools and wants to speak to the nurse to make sure this isn't a crohns flare up.

## 2018-04-29 NOTE — Telephone Encounter (Signed)
Patient reports a one week history of pain and dark stools. His stools are now normal, but has some slight LLQ pain still.  He is offered an appt for today for evaluation.Marland Kitchen  He declines for now.  He understands to call back if he would like to be seen today or can call next week for an appt.

## 2018-05-04 ENCOUNTER — Ambulatory Visit: Payer: Medicare Other | Admitting: Gastroenterology

## 2018-05-04 ENCOUNTER — Encounter: Payer: Self-pay | Admitting: Gastroenterology

## 2018-05-04 VITALS — BP 120/74 | HR 68 | Ht 74.0 in | Wt 214.0 lb

## 2018-05-04 DIAGNOSIS — R1032 Left lower quadrant pain: Secondary | ICD-10-CM | POA: Diagnosis not present

## 2018-05-04 MED ORDER — CIPROFLOXACIN HCL 500 MG PO TABS: 500.0000 mg | ORAL_TABLET | Freq: Two times a day (BID) | ORAL | 0 refills | Status: DC

## 2018-05-04 MED ORDER — METRONIDAZOLE 500 MG PO TABS: 500.0000 mg | ORAL_TABLET | Freq: Three times a day (TID) | ORAL | 0 refills | Status: DC

## 2018-05-04 NOTE — Progress Notes (Signed)
05/04/2018 [name] [id number] Oct 21, 1955   HISTORY OF PRESENT ILLNESS: This is a pleasant 63 year old male who is a patient of Dr. Doyne Keel.  He apparently has a remote history of Crohn's disease that required surgical resection several years ago, but has not been on any medication for Crohn's disease.  Also has history of diverticulitis that required resection in 2014.  His last colonoscopy was in July 2018 at which time he was found to have multiple polyps that were removed and had diverticulosis in the left colon.  He presents to our office today with complaints of left lower quadrant abdominal pain and bloating sensation that has been present for the past 10 days or so.  He also reports some mild change in bowel habits.  Reports that he had one darker colored stool when this initially began and he describes smaller pieces of stools instead of complete, full bowel movements.  He denies seeing any blood in his stools.  Has increased flatulence as well.  He tells me that he had his physical exam with his PCP this morning and had complete labs drawn.  Denies nausea, vomiting, fevers, and chills.  Past Medical History:  Diagnosis Date  . Adenomatous colon polyp   . Anal fissure    pt believes it is 2 inches up  . Anxiety   . Arthritis   . At risk for sleep apnea    STOP-BANG= 5    SENT TO PCP 01-31-2014  . BPH (benign prostatic hypertrophy)   . Chronic lower back pain   . Crohn's disease ([place])    no recurrence  . Diverticulosis of colon   . GERD (gastroesophageal reflux disease)   . Hepatic steatosis   . History of colon polyps    tubular adenoma  . History of diverticulitis of colon    sigmoid-  s/p resection  . History of gastric ulcer   . Inguinal hernia    Past Surgical History:  Procedure Laterality Date  . EXCISIONAL HEMORRHOIDECTOMY  age 11  . INCISIONAL HERNIA REPAIR  2000   and Left Inguinal Hernia repair  . KNEE ARTHROSCOPY Bilateral X4  last one  2011  .  LAPAROSCOPIC PARTIAL COLECTOMY N/A 06/09/2012   Procedure: LAPAROSCOPIC assisted PARTIAL COLECTOMY open ;  Surgeon: Odis Hollingshead, MD;  Location: [facility];  Service: General;  Laterality: N/A;  diverticulitis  . LUMBAR DISC SURGERY  1994   L4 -- L5  . POPLITEAL SYNOVIAL CYST EXCISION Bilateral 2001  . TERMINAL ILEOCOLECTOMY  1993   and Appendectomy  for CROHN'S  . TONSILLECTOMY AND ADENOIDECTOMY  as child  . TRANSURETHRAL RESECTION OF PROSTATE N/A 02/05/2014   Procedure: TRANSURETHRAL RESECTION OF THE PROSTATE WITH GYRUS INSTRUMENTS;  Surgeon: Bernestine Amass, MD;  Location: [facility];  Service: Urology;  Laterality: N/A;    reports that he quit smoking about 14 years ago. His smoking use included cigars. He has never used smokeless tobacco. He reports that he does not drink alcohol or use drugs. family history includes Cirrhosis in his mother; Heart disease in his father. Allergies  Allergen Reactions  . Codeine Itching      Outpatient Encounter Medications as of 05/04/2018  Medication Sig  . FLUoxetine (PROZAC) 10 MG tablet Take 20 mg by mouth at bedtime.  . gabapentin (NEURONTIN) 300 MG capsule 600 mg at bedtime.  Marland Kitchen oxyCODONE-acetaminophen (PERCOCET) 10-325 MG per tablet Take 1 tablet by mouth every 4 (four) hours as needed  for pain.   . pantoprazole (PROTONIX) 20 MG tablet TAKE 1 TABLET(20 MG) BY MOUTH DAILY  . Tamsulosin HCl (FLOMAX) 0.4 MG CAPS Take 0.8 mg by mouth at bedtime.   . [DISCONTINUED] 0.9 %  sodium chloride infusion    No facility-administered encounter medications on file as of 05/04/2018.      REVIEW OF SYSTEMS  : All other systems reviewed and negative except where noted in the History of Present Illness.   PHYSICAL EXAM: BP 120/74   Pulse 68   Ht 6' 2"  (1.88 m)   Wt 214 lb (97.1 kg)   BMI 27.48 kg/m  General: Well developed white male in no acute distress Head: Normocephalic and atraumatic Eyes:  Sclerae anicteric, conjunctiva pink. Ears:  Normal auditory acuity  Lungs: Clear throughout to auscultation; no increased WOB. Heart: Regular rate and rhythm; no M/R/G. Abdomen: Soft, non-distended.  BS present.  LLQ TTP.  Scars noted on abdomen from previous surgeries. Musculoskeletal: Symmetrical with no gross deformities  Skin: No lesions on visible extremities Extremities: No edema  Neurological: Alert oriented x 4, grossly non-focal Psychological:  Alert and cooperative. Normal mood and affect  ASSESSMENT AND PLAN: *LLQ abdominal pain: Has sigmoid diverticulosis by last colonoscopy.  This pain has been present for the past 10 days along with some bloating and mild change in stool pattern.  Will empirically treat as diverticulitis with Cipro 500 mg twice daily and Flagyl 500 mg 3 times daily for 10 days.  We will get results of the labs that he had drawn at his PCPs office today.  I would like him to call us back after completion of antibiotics to give [place] an update on his symptoms and of course call back sooner if symptoms seem without improvement or seem to be worsening at which point we would consider CT scan.   CC:  Chesley Noon, MD

## 2018-05-04 NOTE — Patient Instructions (Signed)
We have sent the following medications to your pharmacy for you to pick up at your convenience: cipro and flagyl  Call us back with an update after you finish your antibiotics or sooner if your symptoms worsen.   We are going to request your lab results from your PCP.   Thank you you allowing [place] to care for you today. Alonza Bogus, PA-C

## 2018-05-05 NOTE — Progress Notes (Signed)
Agree with assessment and plan as outlined.  

## 2018-05-19 ENCOUNTER — Telehealth: Payer: Self-pay | Admitting: Gastroenterology

## 2018-05-19 MED ORDER — DICYCLOMINE HCL 10 MG PO CAPS: 10.0000 mg | ORAL_CAPSULE | Freq: Three times a day (TID) | ORAL | 0 refills | Status: DC | PRN

## 2018-05-19 NOTE — Telephone Encounter (Signed)
Patient advised of this plan. He agrees to this plan of care.

## 2018-05-19 NOTE — Telephone Encounter (Signed)
Seen by Micheal [name], PAC on 05/04/18 for LLQ abdominal pain. Treated with 10 days of Cipro and Flagyl for presumed recurrent diverticulitis. States he completed the antibiotics 05/13/18. He "felt pretty good." He returned to his normal diet eating salads and other fibers as he was accustomed to. Within a couple of days he started having a bloated and tender feeling in the LLQ again. He says it is not as bad as before the antibiotics, but it is persistent, comes and goes, worse when he is lying down. Bowel movements are loose to soft formed. Afebrile. No blood with bowel movements. He is back to a soft diet as of today. Please advise.

## 2018-05-19 NOTE — Telephone Encounter (Signed)
If he has not tried any bentyl yet we can try that (43m every 8 hours #30), if he's not having fevers or severe pain. If pain worsening or fevers would give another course of antibiotics.

## 2018-06-20 ENCOUNTER — Telehealth: Payer: Self-pay | Admitting: Gastroenterology

## 2018-06-20 NOTE — Telephone Encounter (Signed)
Pt states that he is still dealing with same sxs despite having taking several meds for that. He would like a call back.

## 2018-06-20 NOTE — Telephone Encounter (Signed)
Left message on machine to call back  

## 2018-06-21 ENCOUNTER — Other Ambulatory Visit: Payer: Self-pay

## 2018-06-21 MED ORDER — DICYCLOMINE HCL 10 MG PO CAPS: 10.0000 mg | ORAL_CAPSULE | Freq: Three times a day (TID) | ORAL | 3 refills | Status: DC | PRN

## 2018-06-21 NOTE — Progress Notes (Signed)
Refilled Bentyl to [facility]

## 2018-06-24 NOTE — Telephone Encounter (Signed)
Left message on machine to call back  

## 2018-06-29 NOTE — Telephone Encounter (Signed)
No return call from the patient

## 2018-12-15 ENCOUNTER — Other Ambulatory Visit: Payer: Self-pay

## 2018-12-15 DIAGNOSIS — Z20822 Contact with and (suspected) exposure to covid-19: Secondary | ICD-10-CM

## 2018-12-16 LAB — NOVEL CORONAVIRUS, NAA: SARS-CoV-2, NAA: NOT DETECTED

## 2019-03-14 ENCOUNTER — Telehealth: Payer: Self-pay | Admitting: Gastroenterology

## 2019-03-14 MED ORDER — METRONIDAZOLE 500 MG PO TABS: 500.0000 mg | ORAL_TABLET | Freq: Three times a day (TID) | ORAL | 0 refills | Status: AC

## 2019-03-14 MED ORDER — PANTOPRAZOLE SODIUM 20 MG PO TBEC: DELAYED_RELEASE_TABLET | ORAL | 3 refills | Status: DC

## 2019-03-14 MED ORDER — CIPROFLOXACIN HCL 500 MG PO TABS: 500.0000 mg | ORAL_TABLET | Freq: Two times a day (BID) | ORAL | 0 refills | Status: AC

## 2019-03-14 NOTE — Telephone Encounter (Signed)
The pt has been advised of the recommendation.  Prescription has been sent.

## 2019-03-14 NOTE — Telephone Encounter (Signed)
LLQ pain that feels better when up and moving.  He is awakened in the middle of the night with pain.  Also has diarrhea.  No bleeding.  No fever.  He says the pain started about 3 days ago.  He has a history of diverticulitis and colon surgery.  He would like to avoid coming into the office due to [place].  Can we send in abx?  Please advise Dr Azalee Course saw the pt last but you are the primary GI doc.

## 2019-03-14 NOTE — Telephone Encounter (Signed)
Yes that sounds reasonable, can give cipro 52m BID and flagyl 5025mTID for 10 days. If no improvement he should contact usKoreaOf note, he is due for a colonoscopy 10/2019

## 2019-03-27 ENCOUNTER — Ambulatory Visit: Payer: Medicare Other | Admitting: Gastroenterology

## 2019-03-27 ENCOUNTER — Other Ambulatory Visit (INDEPENDENT_AMBULATORY_CARE_PROVIDER_SITE_OTHER): Payer: Medicare Other

## 2019-03-27 ENCOUNTER — Encounter: Payer: Self-pay | Admitting: Gastroenterology

## 2019-03-27 VITALS — BP 142/78 | HR 85 | Temp 97.9°F | Ht 74.0 in | Wt 219.6 lb

## 2019-03-27 DIAGNOSIS — K5730 Diverticulosis of large intestine without perforation or abscess without bleeding: Secondary | ICD-10-CM

## 2019-03-27 DIAGNOSIS — Z86010 Personal history of colonic polyps: Secondary | ICD-10-CM

## 2019-03-27 DIAGNOSIS — R1032 Left lower quadrant pain: Secondary | ICD-10-CM

## 2019-03-27 LAB — COMPREHENSIVE METABOLIC PANEL
ALT: 35 U/L (ref 0–53)
AST: 32 U/L (ref 0–37)
Albumin: 4.6 g/dL (ref 3.5–5.2)
Alkaline Phosphatase: 52 U/L (ref 39–117)
BUN: 7 mg/dL (ref 6–23)
CO2: 28 mEq/L (ref 19–32)
Calcium: 9.4 mg/dL (ref 8.4–10.5)
Chloride: 101 mEq/L (ref 96–112)
Creatinine, Ser: 0.89 mg/dL (ref 0.40–1.50)
GFR: 85.89 mL/min (ref >60.00)
Glucose, Bld: 95 mg/dL (ref 70–99)
Potassium: 3.8 mEq/L (ref 3.5–5.1)
Sodium: 137 mEq/L (ref 135–145)
Total Bilirubin: 0.5 mg/dL (ref 0.2–1.2)
Total Protein: 7.2 g/dL (ref 6.0–8.3)

## 2019-03-27 LAB — CBC WITH DIFFERENTIAL/PLATELET
Basophils Absolute: 0.1 10*3/uL (ref 0.0–0.1)
Basophils Relative: 1.2 % (ref 0.0–3.0)
Eosinophils Absolute: 0.2 10*3/uL (ref 0.0–0.7)
Eosinophils Relative: 1.9 % (ref 0.0–5.0)
HCT: 45.5 % (ref 39.0–52.0)
Hemoglobin: 14.9 g/dL (ref 13.0–17.0)
Lymphocytes Relative: 36.2 % (ref 12.0–46.0)
Lymphs Abs: 4 10*3/uL (ref 0.7–4.0)
MCHC: 32.8 g/dL (ref 30.0–36.0)
MCV: 88.6 fl (ref 78.0–100.0)
Monocytes Absolute: 0.9 10*3/uL (ref 0.1–1.0)
Monocytes Relative: 7.9 % (ref 3.0–12.0)
Neutro Abs: 5.8 10*3/uL (ref 1.4–7.7)
Neutrophils Relative %: 52.8 % (ref 43.0–77.0)
Platelets: 270 10*3/uL (ref 150.0–400.0)
RBC: 5.13 Mil/uL (ref 4.22–5.81)
RDW: 14.3 % (ref 11.5–15.5)
WBC: 11 10*3/uL — ABNORMAL HIGH (ref 4.0–10.5)

## 2019-03-27 MED ORDER — DICYCLOMINE HCL 10 MG PO CAPS: 10.0000 mg | ORAL_CAPSULE | Freq: Three times a day (TID) | ORAL | 3 refills | Status: DC | PRN

## 2019-03-27 NOTE — Telephone Encounter (Signed)
Pt reported that he has finished the course of cipro and flagyl and is still experiencing symptoms.  Please advise.

## 2019-03-27 NOTE — Telephone Encounter (Signed)
Patient reports continued LLQ abdominal pain.  He will come in and see Dr. Havery Moros today at 3:00

## 2019-03-27 NOTE — Progress Notes (Signed)
HPI :  63 y/o male here for a follow up visit. He has a history of Crohn's disease and diverticulitis.   Crohn's history: Diagnosed with Crohns disease when he had ileocolonic resection in the 1990s or so for abdominal pain. Over time he reported being on a sulfasalazine for a period of time, but mostly not on any maintenance therapy for years. He has not had any overt recurrence of disease. Colonoscopy in 2013 and in 2018 in which there was no evidence of active Crohns at the surgical anastomosis. He has had severe diverticulosis leading to resection of that in the past.   He saw [place] back in January of this year for abdominal pain in the left lower quadrant.  He was treated empirically for diverticulitis with antibiotics and his symptoms got much better.He has done well the rest of this year without pain, He unfortunately had recurrence of pain earlier this month, round December 8 he called in to [place] complaining of recurrent left lower quadrant pain.  We gave him an empiric course of Cipro and Flagyl for 10 days.  He states he took the medications and seems like it helped somewhat but has certainly not relieved it and his symptoms continue to bother him.  He has fairly persistent left lower quadrant discomfort most of the time.  He states when he is lying down at night and moves back and forth on his side this tends to bother him and makes it hard to sleep.  He has some mild tenderness to palpation.  He denies any fevers at all.  No nausea or vomiting, he is eating well.  No weight loss, in fact he is gaining weight.  He has had some increased in stool frequency since the antibiotics, which he completed this past Friday.  He is having 3-4 bowel movements a day which are formed, he thinks getting back to normal after completing the antibiotics.  He thinks perhaps he has some relief with passage of a bowel movement.  No blood in his stools.  He thinks this feels like prior diverticulitis type symptoms but he  is not sure.  The pain can radiate into his left testicle at times.  He takes Percocet chronically for back pain.  His last colonoscopy was in 2018, he had multiple small adenomas removed, due for repeat colonoscopy in 2021.  Also noted to have left-sided diverticulosis   Past Medical History:  Diagnosis Date  . Adenomatous colon polyp   . Anal fissure    pt believes it is 2 inches up  . Anxiety   . Arthritis   . At risk for sleep apnea    STOP-BANG= 5    SENT TO PCP 01-31-2014  . BPH (benign prostatic hypertrophy)   . Chronic lower back pain   . Crohn's disease ([place])    no recurrence  . Diverticulosis of colon   . GERD (gastroesophageal reflux disease)   . Hepatic steatosis   . History of colon polyps    tubular adenoma  . History of diverticulitis of colon    sigmoid-  s/p resection  . History of gastric ulcer   . Inguinal hernia      Past Surgical History:  Procedure Laterality Date  . EXCISIONAL HEMORRHOIDECTOMY  age 24  . INCISIONAL HERNIA REPAIR  2000   and Left Inguinal Hernia repair  . KNEE ARTHROSCOPY Bilateral X4  last one  2011  . LAPAROSCOPIC PARTIAL COLECTOMY N/A 06/09/2012   Procedure: LAPAROSCOPIC assisted  PARTIAL COLECTOMY open ;  Surgeon: Odis Hollingshead, MD;  Location: [facility];  Service: General;  Laterality: N/A;  diverticulitis  . LUMBAR DISC SURGERY  1994   L4 -- L5  . POPLITEAL SYNOVIAL CYST EXCISION Bilateral 2001  . TERMINAL ILEOCOLECTOMY  1993   and Appendectomy  for CROHN'S  . TONSILLECTOMY AND ADENOIDECTOMY  as child  . TRANSURETHRAL RESECTION OF PROSTATE N/A 02/05/2014   Procedure: TRANSURETHRAL RESECTION OF THE PROSTATE WITH GYRUS INSTRUMENTS;  Surgeon: Bernestine Amass, MD;  Location: [facility];  Service: Urology;  Laterality: N/A;   Family History  Problem Relation Age of Onset  . Heart disease Father   . Cirrhosis Mother        alcohol induced  . Colon cancer Neg Hx    Social History   Tobacco Use  . Smoking status:  Former Smoker    Types: Cigars    Quit date: 02/02/2004    Years since quitting: 15.1  . Smokeless tobacco: Never Used  Substance Use Topics  . Alcohol use: No  . Drug use: Yes    Types: Marijuana   Current Outpatient Medications  Medication Sig Dispense Refill  . FLUoxetine (PROZAC) 10 MG tablet Take 20 mg by mouth at bedtime.    . gabapentin (NEURONTIN) 300 MG capsule 600 mg at bedtime.  1  . oxyCODONE-acetaminophen (PERCOCET) 10-325 MG per tablet Take 1 tablet by mouth every 4 (four) hours as needed for pain.     . pantoprazole (PROTONIX) 20 MG tablet TAKE 1 TABLET(20 MG) BY MOUTH DAILY 90 tablet 3  . Tamsulosin HCl (FLOMAX) 0.4 MG CAPS Take 0.8 mg by mouth at bedtime.      No current facility-administered medications for this visit.   Allergies  Allergen Reactions  . Codeine Itching     Review of Systems: All systems reviewed and negative except where noted in HPI.   Lab Results  Component Value Date   WBC 11.9 (H) 06/11/2016   HGB 15.2 06/11/2016   HCT 45.4 06/11/2016   MCV 88.1 06/11/2016   PLT 249.0 06/11/2016     Lab Results  Component Value Date   CREATININE 0.98 06/11/2016   BUN 9 06/11/2016   NA 139 06/11/2016   K 3.9 06/11/2016   CL 104 06/11/2016   CO2 30 06/11/2016     Lab Results  Component Value Date   ALT 22 06/11/2016   AST 20 06/11/2016   ALKPHOS 58 06/11/2016   BILITOT 0.5 06/11/2016     Physical Exam: BP (!) 142/78   Pulse 85   Temp 97.9 F (36.6 C)   Ht 6' 2"  (1.88 m)   Wt 219 lb 9.6 oz (99.6 kg)   BMI 28.19 kg/m  Constitutional: Pleasant,well-developed, male in no acute distress. HEENT: Normocephalic and atraumatic. Conjunctivae are normal. No scleral icterus. Neck supple.  Cardiovascular: Normal rate, regular rhythm.  Pulmonary/chest: Effort normal and breath sounds normal. No wheezing, rales or rhonchi. Abdominal: Soft, nondistended, mild LLQ TTP, no peritoneal signs  There are no masses palpable. No  hepatomegaly. Extremities: no edema Lymphadenopathy: No cervical adenopathy noted. Neurological: Alert and oriented to person place and time. Skin: Skin is warm and dry. No rashes noted. Psychiatric: Normal mood and affect. Behavior is normal.   ASSESSMENT AND PLAN: 63 year old male here for reassessment of the following:  Left lower quadrant pain / history of diverticulitis - reported remote history of Crohn's disease leading to resection of his bowel and  not on any maintenance therapy with no known recurrent disease over time, history of left-sided diverticulitis status post resection in the past, now with recurrent abdominal pain.  Treated empirically with a course of antibiotics for possible diverticulitis, which has not made too much difference thus far, has a positional component of his pain with some radiation into his testicle.  Going to check some basic labs today to make sure no significant leukocytosis, and ultimately refer for a CT scan to be done as soon as possible to make sure there is no evidence of diverticulitis, complications of diverticulitis, Crohn's disease, etc.  Renal stones are also in the differential, as well as musculoskeletal pain, etc.  I will give him some Bentyl to use as needed to see if this will help some.  Hopefully get a CT scan done within the next 1 to 2 days.  I counseled him that if he has any fevers or worsening pain to contact me and we will have low threshold to give him another course of antibiotics again, but will hold off for now given lack of significant benefit after his first course.  He agreed  History of colon adenomas - due for surveillance colonoscopy in 2021. May need it sooner pending results of the CT Scan.   [place] Cellar, MD [facility]

## 2019-03-27 NOTE — Patient Instructions (Signed)
If you are age 64 or older, your body mass index should be between 23-30. Your Body mass index is 28.19 kg/m. If this is out of the aforementioned range listed, please consider follow up with your Primary Care Provider.  If you are age 43 or younger, your body mass index should be between 19-25. Your Body mass index is 28.19 kg/m. If this is out of the aformentioned range listed, please consider follow up with your Primary Care Provider.   Your provider has requested that you go to the basement level for lab work before leaving today. Press "B" on the elevator. The lab is located at the first door on the left as you exit the elevator.  We have sent the following medications to your pharmacy for you to pick up at your convenience: Bentyl   You have been scheduled for a CT scan of the abdomen and pelvis at [facility] (1st floor radiology)  You are scheduled on Tuesday 03/28/19 at 10 am. You should arrive 15 minutes prior to your appointment time for registration. Please follow the written instructions below on the day of your exam:  WARNING: IF YOU ARE ALLERGIC TO IODINE/X-RAY DYE, PLEASE NOTIFY RADIOLOGY IMMEDIATELY AT [phone]! YOU WILL BE GIVEN A 13 HOUR PREMEDICATION PREP.  1) Do not eat or drink anything after 6 am (4 hours prior to your test) 2) You have been given 2 bottles of oral contrast to drink. The solution may taste better if refrigerated, but do NOT add ice or any other liquid to this solution. Shake well before drinking.    Drink 1 bottle of contrast @ 8 am (2 hours prior to your exam)  Drink 1 bottle of contrast @ 9 am (1 hour prior to your exam)  You may take any medications as prescribed with a small amount of water, if necessary. If you take any of the following medications: METFORMIN, GLUCOPHAGE, GLUCOVANCE, AVANDAMET, RIOMET, FORTAMET, [place] MET, JANUMET, GLUMETZA or METAGLIP, you MAY be asked to HOLD this medication 48 hours AFTER the exam.  The purpose  of you drinking the oral contrast is to aid in the visualization of your intestinal tract. The contrast solution may cause some diarrhea. Depending on your individual set of symptoms, you may also receive an intravenous injection of x-ray contrast/dye.  _________________________________________________________________  Thank you for entrusting me with your care and for choosing [facility], Dr. Cascade Cellar

## 2019-03-28 ENCOUNTER — Other Ambulatory Visit: Payer: Self-pay

## 2019-03-28 ENCOUNTER — Encounter (HOSPITAL_COMMUNITY): Payer: Self-pay

## 2019-03-28 ENCOUNTER — Ambulatory Visit (HOSPITAL_COMMUNITY)
Admission: RE | Admit: 2019-03-28 | Discharge: 2019-03-28 | Disposition: A | Discharge status: Home / Self Care | Payer: Medicare Other | Source: Ambulatory Visit | Attending: Gastroenterology | Admitting: Gastroenterology

## 2019-03-28 ENCOUNTER — Telehealth: Payer: Self-pay

## 2019-03-28 DIAGNOSIS — R1032 Left lower quadrant pain: Secondary | ICD-10-CM | POA: Insufficient documentation

## 2019-03-28 DIAGNOSIS — K5730 Diverticulosis of large intestine without perforation or abscess without bleeding: Secondary | ICD-10-CM | POA: Diagnosis present

## 2019-03-28 MED ORDER — SODIUM CHLORIDE (PF) 0.9 % IJ SOLN
INTRAMUSCULAR | Status: AC
  Filled 2019-03-28: qty 50

## 2019-03-28 MED ORDER — IOHEXOL 300 MG/ML  SOLN
100.0000 mL | Freq: Once | INTRAMUSCULAR | Status: AC | PRN
  Administered 2019-03-28: 100 mL via INTRAVENOUS

## 2019-03-28 NOTE — Telephone Encounter (Signed)
Left message to please call back

## 2019-05-09 ENCOUNTER — Telehealth: Payer: Self-pay | Admitting: Gastroenterology

## 2019-05-09 NOTE — Telephone Encounter (Signed)
The pt calls to complain about continued abd pain.  He states that it is the same pain he had when he was seen on 12/21 Dr Havery Moros.  Dr Havery Moros first appt is 3/3 the pt did not wish to wait until then.  Appt made for 2/3 with Janett Billow

## 2019-05-11 ENCOUNTER — Ambulatory Visit: Payer: Medicare Other | Admitting: Gastroenterology

## 2019-05-11 ENCOUNTER — Encounter: Payer: Self-pay | Admitting: Gastroenterology

## 2019-05-11 ENCOUNTER — Other Ambulatory Visit: Payer: Self-pay

## 2019-05-11 VITALS — BP 120/86 | HR 86 | Temp 97.8°F | Ht 74.0 in | Wt 222.0 lb

## 2019-05-11 DIAGNOSIS — Z01818 Encounter for other preprocedural examination: Secondary | ICD-10-CM

## 2019-05-11 DIAGNOSIS — R1032 Left lower quadrant pain: Secondary | ICD-10-CM

## 2019-05-11 MED ORDER — SUPREP BOWEL PREP KIT 17.5-3.13-1.6 GM/177ML PO SOLN: 1.0000 | ORAL | 0 refills | Status: DC

## 2019-05-11 NOTE — Patient Instructions (Signed)
You have been scheduled for a colonoscopy. Please follow written instructions given to you at your visit today.  Please pick up your prep supplies at the pharmacy within the next 1-3 days. If you use inhalers (even only as needed), please bring them with you on the day of your procedure.   If you are age 65 or younger, your body mass index should be between 19-25. Your Body mass index is 28.5 kg/m. If this is out of the aformentioned range listed, please consider follow up with your Primary Care Provider.    We have sent the following medications to your pharmacy for you to pick up at your convenience: Suprep   Thank you for choosing me and [facility].  Janett Billow Zehr-PA

## 2019-05-11 NOTE — Progress Notes (Signed)
05/11/2019 [name] [id number] 1955/08/26   HISTORY OF PRESENT ILLNESS: This is a 64 year old male who is a patient of Dr. Doyne Keel.  Has been dealing with issues of left lower quadrant abdominal pain for at least the past year.  He was last seen here by Dr. Havery Moros on March 27, 2019.  Please see that note for further details.  Anyways, at that time Dr. Havery Moros ordered a CT scan of the abdomen and pelvis with contrast.  This showed diverticulosis, but no acute diverticulitis and no other cause of his pain was identified.  He was given Bentyl to try.  Considered musculoskeletal source of pain as a potential cause.  Does have remote history of Crohn's disease for which he had surgery, but has not been on any maintenance medications.  Patient presents here today with continued complaints of left lower quadrant abdominal pain.  He says that some days it is worse than others.  Believes that his oxycodone medication that he takes for his back does help to a degree, but still very uncomfortable at times especially at night.  He says that he is moving his bowels without any issues.  Bentyl does not seem to be helping.  Denies any nausea, vomiting, fever.  He is due for recall colonoscopy in July for history of adenomatous colon polyps.  He states "I wonder if I have some sort of rough spot in my colon or something ".   Past Medical History:  Diagnosis Date  . Adenomatous colon polyp   . Anal fissure    pt believes it is 2 inches up  . Anxiety   . Arthritis   . At risk for sleep apnea    STOP-BANG= 5    SENT TO PCP 01-31-2014  . BPH (benign prostatic hypertrophy)   . Chronic lower back pain   . Crohn's disease ([place])    no recurrence  . Diverticulosis of colon   . GERD (gastroesophageal reflux disease)   . Hepatic steatosis   . History of colon polyps    tubular adenoma  . History of diverticulitis of colon    sigmoid-  s/p resection  . History of gastric ulcer   . Inguinal  hernia    Past Surgical History:  Procedure Laterality Date  . EXCISIONAL HEMORRHOIDECTOMY  age 51  . INCISIONAL HERNIA REPAIR  2000   and Left Inguinal Hernia repair  . KNEE ARTHROSCOPY Bilateral X4  last one  2011  . LAPAROSCOPIC PARTIAL COLECTOMY N/A 06/09/2012   Procedure: LAPAROSCOPIC assisted PARTIAL COLECTOMY open ;  Surgeon: Odis Hollingshead, MD;  Location: [facility];  Service: General;  Laterality: N/A;  diverticulitis  . LUMBAR DISC SURGERY  1994   L4 -- L5  . POPLITEAL SYNOVIAL CYST EXCISION Bilateral 2001  . TERMINAL ILEOCOLECTOMY  1993   and Appendectomy  for CROHN'S  . TONSILLECTOMY AND ADENOIDECTOMY  as child  . TRANSURETHRAL RESECTION OF PROSTATE N/A 02/05/2014   Procedure: TRANSURETHRAL RESECTION OF THE PROSTATE WITH GYRUS INSTRUMENTS;  Surgeon: Bernestine Amass, MD;  Location: [facility];  Service: Urology;  Laterality: N/A;    reports that he quit smoking about 15 years ago. His smoking use included cigars. He has never used smokeless tobacco. He reports current drug use. Drug: Marijuana. He reports that he does not drink alcohol. family history includes Cirrhosis in his mother; Heart disease in his father. Allergies  Allergen Reactions  . Codeine Itching  Outpatient Encounter Medications as of 05/11/2019  Medication Sig  . dicyclomine (BENTYL) 10 MG capsule Take 1 capsule (10 mg total) by mouth every 8 (eight) hours as needed for spasms.  Marland Kitchen FLUoxetine (PROZAC) 10 MG tablet Take 20 mg by mouth at bedtime.  . gabapentin (NEURONTIN) 300 MG capsule 600 mg at bedtime.  Marland Kitchen oxyCODONE-acetaminophen (PERCOCET) 10-325 MG per tablet Take 1 tablet by mouth every 4 (four) hours as needed for pain.   . pantoprazole (PROTONIX) 20 MG tablet TAKE 1 TABLET(20 MG) BY MOUTH DAILY  . Tamsulosin HCl (FLOMAX) 0.4 MG CAPS Take 0.8 mg by mouth at bedtime.    No facility-administered encounter medications on file as of 05/11/2019.     REVIEW OF SYSTEMS  : All other systems  reviewed and negative except where noted in the History of Present Illness.   PHYSICAL EXAM: BP 120/86 (BP Location: Left Arm, Patient Position: Sitting, Cuff Size: Normal)   Pulse 86   Temp 97.8 F (36.6 C)   Ht 6' 2"  (1.88 m)   Wt 222 lb (100.7 kg)   SpO2 95%   BMI 28.50 kg/m  General: Well developed white male in no acute distress Head: Normocephalic and atraumatic Eyes:  Sclerae anicteric, conjunctiva pink. Ears: Normal auditory acuity Lungs: Clear throughout to auscultation; no increased WOB. Heart: Regular rate and rhythm; no M/R/G. Abdomen: Soft, non-distended.  BS present.  LLQ tenderness to deep palpation in one specific spot.   Rectal:  Will be done at the time of colonoscopy. Musculoskeletal: Symmetrical with no gross deformities  Skin: No lesions on visible extremities Extremities: No edema  Neurological: Alert oriented x 4, grossly non-focal Psychological:  Alert and cooperative. Normal mood and affect  ASSESSMENT AND PLAN: *LLQ abdominal pain: Persistent since his visit with Dr. Havery Moros in December.  Bentyl does not seem to help.  CT scan negative for diverticulitis or other cause of pain.  Question musculoskeletal.  Is due for colonoscopy recall in July.  We will move that up and just plan to do that sooner/now to rule out any other colon source of pain, ie, recurrent mild Crohn's etc.  ? Musculoskeletal source.   CC:  Chesley Noon, MD

## 2019-05-11 NOTE — Progress Notes (Signed)
Agree with assessment and plan as outlined. Suspect this may be musculoskeletal pain in etiology but will make sure no luminal colonic source given location. He had numerous polyps removed in 2018.

## 2019-05-12 ENCOUNTER — Encounter: Payer: Self-pay | Admitting: Gastroenterology

## 2019-05-16 ENCOUNTER — Other Ambulatory Visit: Payer: Self-pay

## 2019-05-16 ENCOUNTER — Ambulatory Visit (INDEPENDENT_AMBULATORY_CARE_PROVIDER_SITE_OTHER): Payer: Medicare Other

## 2019-05-16 DIAGNOSIS — Z1159 Encounter for screening for other viral diseases: Secondary | ICD-10-CM

## 2019-05-17 LAB — SARS CORONAVIRUS 2 (TAT 6-24 HRS): SARS Coronavirus 2: NEGATIVE

## 2019-05-18 ENCOUNTER — Encounter: Payer: Self-pay | Admitting: Gastroenterology

## 2019-05-18 ENCOUNTER — Ambulatory Visit (AMBULATORY_SURGERY_CENTER): Payer: Medicare Other | Admitting: Gastroenterology

## 2019-05-18 ENCOUNTER — Other Ambulatory Visit: Payer: Self-pay

## 2019-05-18 VITALS — BP 119/69 | HR 64 | Temp 97.3°F | Resp 13 | Ht 74.0 in | Wt 222.0 lb

## 2019-05-18 DIAGNOSIS — K5730 Diverticulosis of large intestine without perforation or abscess without bleeding: Secondary | ICD-10-CM

## 2019-05-18 DIAGNOSIS — R1032 Left lower quadrant pain: Secondary | ICD-10-CM

## 2019-05-18 DIAGNOSIS — K6282 Dysplasia of anus: Secondary | ICD-10-CM

## 2019-05-18 DIAGNOSIS — K635 Polyp of colon: Secondary | ICD-10-CM

## 2019-05-18 DIAGNOSIS — K5090 Crohn's disease, unspecified, without complications: Secondary | ICD-10-CM | POA: Diagnosis not present

## 2019-05-18 DIAGNOSIS — K6289 Other specified diseases of anus and rectum: Secondary | ICD-10-CM

## 2019-05-18 DIAGNOSIS — D122 Benign neoplasm of ascending colon: Secondary | ICD-10-CM | POA: Diagnosis not present

## 2019-05-18 MED ORDER — SODIUM CHLORIDE 0.9 % IV SOLN: 500.0000 mL | Freq: Once | INTRAVENOUS | Status: DC

## 2019-05-18 NOTE — Progress Notes (Signed)
Called to room to assist during endoscopic procedure.  Patient ID and intended procedure confirmed with present staff. Received instructions for my participation in the procedure from the performing physician.  

## 2019-05-18 NOTE — Progress Notes (Signed)
Temp check by:LC Vital check by:DT  The medical and surgical history was reviewed and verified with the patient.

## 2019-05-18 NOTE — Patient Instructions (Signed)
YOU HAD AN ENDOSCOPIC PROCEDURE TODAY AT [facility]:   Refer to the procedure report that was given to you for any specific questions about what was found during the examination.  If the procedure report does not answer your questions, please call your gastroenterologist to clarify.  If you requested that your care partner not be given the details of your procedure findings, then the procedure report has been included in a sealed envelope for you to review at your convenience later.  YOU SHOULD EXPECT: Some feelings of bloating in the abdomen. Passage of more gas than usual.  Walking can help get rid of the air that was put into your GI tract during the procedure and reduce the bloating. If you had a lower endoscopy (such as a colonoscopy or flexible sigmoidoscopy) you may notice spotting of blood in your stool or on the toilet paper. If you underwent a bowel prep for your procedure, you may not have a normal bowel movement for a few days.  Please Note:  You might notice some irritation and congestion in your nose or some drainage.  This is from the oxygen used during your procedure.  There is no need for concern and it should clear up in a day or so.  SYMPTOMS TO REPORT IMMEDIATELY:   Following lower endoscopy (colonoscopy or flexible sigmoidoscopy):  Excessive amounts of blood in the stool  Significant tenderness or worsening of abdominal pains  Swelling of the abdomen that is new, acute  Fever of 100F or higher  For urgent or emergent issues, a gastroenterologist can be reached at any hour by calling [phone].   DIET:  We do recommend a small meal at first, but then you may proceed to your regular diet.  Drink plenty of fluids but you should avoid alcoholic beverages for 24 hours.  MEDICATIONS: Continue present medications.  Please see handouts given to you by your recovery nurse.  ACTIVITY:  You should plan to take it easy for the rest of today and you should NOT  DRIVE or use heavy machinery until tomorrow (because of the sedation medicines used during the test).    FOLLOW UP: Our staff will call the number listed on your records 48-72 hours following your procedure to check on you and address any questions or concerns that you may have regarding the information given to you following your procedure. If we do not reach you, we will leave a message.  We will attempt to reach you two times.  During this call, we will ask if you have developed any symptoms of COVID 19. If you develop any symptoms (ie: fever, flu-like symptoms, shortness of breath, cough etc.) before then, please call [phone].  If you test positive for Covid 19 in the 2 weeks post procedure, please call and report this information to [place].    If any biopsies were taken you will be contacted by phone or by letter within the next 1-3 weeks.  Please call us at [phone] if you have not heard about the biopsies in 3 weeks.   Thank you for allowing [place] to provide for your healthcare needs today.   SIGNATURES/CONFIDENTIALITY: You and/or your care partner have signed paperwork which will be entered into your electronic medical record.  These signatures attest to the fact that that the information above on your After Visit Summary has been reviewed and is understood.  Full responsibility of the confidentiality of this discharge information lies with you and/or your  care-partner.

## 2019-05-18 NOTE — Progress Notes (Signed)
Pt tolerated well. VSS. Awake and to recovery. 

## 2019-05-18 NOTE — Op Note (Signed)
[place] Patient Name: Hamzeh Tall Procedure Date: 05/18/2019 2:04 PM MRN: [id number] Endoscopist: Remo Lipps P. Mohid Furuya , MD Age: 64 Referring MD:  Date of Birth: 1955/08/10 Gender: Male Account #: [id number] Procedure:                Colonoscopy Indications:              Abdominal pain in the left lower quadrant, numerous                            colon polyps removed in 2018, history of Crohn's                            disease in remission, history of ileocecectomy, CT                            scan negative, abdominal pain persists in LLQ to                            lower abdomen Medicines:                Monitored Anesthesia Care Procedure:                Pre-Anesthesia Assessment:                           - Prior to the procedure, a History and Physical                            was performed, and patient medications and                            allergies were reviewed. The patient's tolerance of                            previous anesthesia was also reviewed. The risks                            and benefits of the procedure and the sedation                            options and risks were discussed with the patient.                            All questions were answered, and informed consent                            was obtained. Prior Anticoagulants: The patient has                            taken no previous anticoagulant or antiplatelet                            agents. ASA Grade Assessment: III - A patient with  severe systemic disease. After reviewing the risks                            and benefits, the patient was deemed in                            satisfactory condition to undergo the procedure.                           After obtaining informed consent, the colonoscope                            was passed under direct vision. Throughout the                            procedure, the patient's blood pressure,  pulse, and                            oxygen saturations were monitored continuously. The                            Colonoscope was introduced through the anus and                            advanced to the the ileocolonic anastomosis. The                            colonoscopy was performed without difficulty. The                            patient tolerated the procedure well. The quality                            of the bowel preparation was good. The terminal                            ileum and the rectum were photographed. Scope In: 2:08:30 PM Scope Out: 2:30:00 PM Scope Withdrawal Time: 0 hours 19 minutes 24 seconds  Total Procedure Duration: 0 hours 21 minutes 30 seconds  Findings:                 The perianal and digital rectal examinations were                            normal.                           The terminal ileum appeared normal.                           There was evidence of a prior end-to-side                            ileo-colonic anastomosis in the ascending colon.  This was patent and was characterized by healthy                            appearing mucosa.                           There was evidence of a prior end-to-end                            colo-colonic anastomosis in the sigmoid colon. This                            was patent and was characterized by healthy                            appearing mucosa.                           Two sessile polyps were found in the ascending                            colon. The polyps were 4 mm in size. These polyps                            were removed with a cold snare. Resection and                            retrieval were complete.                           Scattered medium-mouthed diverticula were found in                            the transverse colon and left colon.                           Anal papilla(e) were hypertrophied. Biopsies were                            taken  with a cold forceps for histology to rule out                            AIN.                           The exam was otherwise without abnormality. Complications:            No immediate complications. Estimated blood loss:                            Minimal. Estimated Blood Loss:     Estimated blood loss was minimal. Impression:               - The examined portion of the ileum was normal.                           -  Patent end-to-side ileo-colonic anastomosis,                            characterized by healthy appearing mucosa.                           - Patent end-to-end colo-colonic anastomosis,                            characterized by healthy appearing mucosa.                           - Two 4 mm polyps in the ascending colon, removed                            with a cold snare. Resected and retrieved.                           - Diverticulosis in the transverse colon and in the                            left colon.                           - Anal papilla(e) were hypertrophied. Biopsied.                           - The examination was otherwise normal.                           No evidence of Crohn's disease, no pathology noted                            to cause abdominal pain on this exam. I suspect                            this pain could be musculoskeletal Recommendation:           - Patient has a contact number available for                            emergencies. The signs and symptoms of potential                            delayed complications were discussed with the                            patient. Return to normal activities tomorrow.                            Written discharge instructions were provided to the                            patient.                           -  Resume previous diet.                           - Continue present medications.                           - Await pathology results.                           - Will discuss options with  patient regarding                            treatment of pain Keyshawn Hellwig P. Arvie Villarruel, MD 05/18/2019 2:37:53 PM This report has been signed electronically.

## 2019-05-22 ENCOUNTER — Telehealth: Payer: Self-pay

## 2019-05-22 NOTE — Telephone Encounter (Signed)
NO ANSWER, MESSAGE LEFT FOR PATIENT.

## 2019-05-22 NOTE — Telephone Encounter (Signed)
2nd follow up call made.  NALM 

## 2019-06-01 ENCOUNTER — Telehealth: Payer: Self-pay

## 2019-06-01 NOTE — Telephone Encounter (Signed)
Received fax from [place] that patient has an appt. with Dr. Marcello Moores on 06/20/19 @ 10:30am

## 2019-06-20 ENCOUNTER — Ambulatory Visit: Payer: Self-pay | Admitting: General Surgery

## 2019-06-20 NOTE — H&P (View-Only) (Signed)
  The patient is a 64 year old male who presents with a complaint of anal problems. Patient with a history of bilateral inguinal hernia repair and Crohn's disease who presents to the office with multiple complaints. He complains of left groin pain with lifting. He also has some anal discomfort after bowel movements. He recently underwent a colonoscopy which shows an anal skin tag. Biopsy showed AIN. He is currently in remission from his Crohn's disease and is not on any medication for this except Bentyl for colon spasms. s/p sigmoidectomy in 2014.    Past Surgical History (Tanisha A. Owens Shark, [place]; 06/20/2019 10:18 AM) Appendectomy Colon Polyp Removal - Colonoscopy Colon Removal - Partial Hemorrhoidectomy Knee Surgery Bilateral. Laparoscopic Inguinal Hernia Surgery Bilateral. multiple Resection of Small Bowel Spinal Surgery - Lower Back TURP  Diagnostic Studies History (Tanisha A. Owens Shark, [place]; 06/20/2019 10:18 AM) Colonoscopy within last year  Allergies (Tanisha A. Owens Shark, [place]; 06/20/2019 10:18 AM) No Known Drug Allergies [06/20/2019]: Allergies Reconciled  Medication History (Tanisha A. Owens Shark, [place]; 06/20/2019 10:20 AM) oxyCODONE-Acetaminophen (5-325MG Tablet, Oral) Active. FLUoxetine HCl (20MG Tablet, Oral) Active. Tamsulosin HCl (0.4MG Capsule, Oral) Active. Protonix (20MG Tablet DR, Oral) Active. Gabapentin (300MG Tablet, Oral) Active. Medications Reconciled  Family History (Tanisha A. Owens Shark, [place]; 06/20/2019 10:18 AM) Arthritis Father. Colon Polyps Father. Depression Father. Ischemic Bowel Disease Father.  Other Problems (Tanisha A. Owens Shark, [place]; 06/20/2019 10:18 AM) Anxiety Disorder Back Pain Crohn's Disease Depression Diverticulosis Enlarged Prostate Gastric Ulcer Hemorrhoids High blood pressure Inguinal Hernia Sleep Apnea     Review of Systems (Tanisha A. Brown RMA; 06/20/2019 10:18 AM) General Present- Weight Gain. Not Present-  Appetite Loss, Chills, Fatigue, Fever, Night Sweats and Weight Loss. Skin Present- Dryness. Not Present- Change in Wart/Mole, Hives, Jaundice, New Lesions, Non-Healing Wounds, Rash and Ulcer. HEENT Present- Ringing in the Ears and Seasonal Allergies. Not Present- Earache, Hearing Loss, Hoarseness, Nose Bleed, Oral Ulcers, Sinus Pain, Sore Throat, Visual Disturbances, Wears glasses/contact lenses and Yellow Eyes. Respiratory Present- Snoring. Not Present- Bloody sputum, Chronic Cough, Difficulty Breathing and Wheezing. Cardiovascular Not Present- Chest Pain, Difficulty Breathing Lying Down, Leg Cramps, Palpitations, Rapid Heart Rate, Shortness of Breath and Swelling of Extremities. Gastrointestinal Not Present- Abdominal Pain, Bloating, Bloody Stool, Change in Bowel Habits, Chronic diarrhea, Constipation, Difficulty Swallowing, Excessive gas, Gets full quickly at meals, Hemorrhoids, Indigestion, Nausea, Rectal Pain and Vomiting.  Vitals (Tanisha A. Brown RMA; 06/20/2019 10:20 AM) 06/20/2019 10:20 AM Weight: 223.2 lb Height: 74in Body Surface Area: 2.28 m Body Mass Index: 28.66 kg/m  Temp.: 98.76F  Pulse: 103 (Regular)  BP: 138/84 (Sitting, Left Arm, Standard)        Physical Exam Leighton Ruff MD; 1/44/3154 10:41 AM)  General Mental Status-Alert. General Appearance-Cooperative.  Abdomen Palpation/Percussion Palpation and Percussion of the abdomen reveal - Soft and Non Tender. Note: no L ing hernia noted.  Rectal Anorectal Exam External - normal external exam. Internal - Note: anal papilla, post midline.    Assessment & Plan Leighton Ruff MD; 0/11/6759 10:44 AM)  Natalia Leatherwood ANAL PAPILLA (K62.89) Impression: 64 year old male who presents to the office for evaluation of anal dysplasia seen on anal papilla during colonoscopy. Patient does report some discomfort with bowel movements. We have discussed the benign nature of anal papilla. Given that he is  symptomatic, I have recommended excision. We discussed typical operative procedure and postoperative pain and recovery time. All questions were answered.

## 2019-06-20 NOTE — H&P (Signed)
  The patient is a 64 year old male who presents with a complaint of anal problems. Patient with a history of bilateral inguinal hernia repair and Crohn's disease who presents to the office with multiple complaints. He complains of left groin pain with lifting. He also has some anal discomfort after bowel movements. He recently underwent a colonoscopy which shows an anal skin tag. Biopsy showed AIN. He is currently in remission from his Crohn's disease and is not on any medication for this except Bentyl for colon spasms. s/p sigmoidectomy in 2014.    Past Surgical History (Tanisha A. Owens Shark, [place]; 06/20/2019 10:18 AM) Appendectomy Colon Polyp Removal - Colonoscopy Colon Removal - Partial Hemorrhoidectomy Knee Surgery Bilateral. Laparoscopic Inguinal Hernia Surgery Bilateral. multiple Resection of Small Bowel Spinal Surgery - Lower Back TURP  Diagnostic Studies History (Tanisha A. Owens Shark, [place]; 06/20/2019 10:18 AM) Colonoscopy within last year  Allergies (Tanisha A. Owens Shark, [place]; 06/20/2019 10:18 AM) No Known Drug Allergies [06/20/2019]: Allergies Reconciled  Medication History (Tanisha A. Owens Shark, [place]; 06/20/2019 10:20 AM) oxyCODONE-Acetaminophen (5-325MG Tablet, Oral) Active. FLUoxetine HCl (20MG Tablet, Oral) Active. Tamsulosin HCl (0.4MG Capsule, Oral) Active. Protonix (20MG Tablet DR, Oral) Active. Gabapentin (300MG Tablet, Oral) Active. Medications Reconciled  Family History (Tanisha A. Owens Shark, [place]; 06/20/2019 10:18 AM) Arthritis Father. Colon Polyps Father. Depression Father. Ischemic Bowel Disease Father.  Other Problems (Tanisha A. Owens Shark, [place]; 06/20/2019 10:18 AM) Anxiety Disorder Back Pain Crohn's Disease Depression Diverticulosis Enlarged Prostate Gastric Ulcer Hemorrhoids High blood pressure Inguinal Hernia Sleep Apnea     Review of Systems (Tanisha A. Brown RMA; 06/20/2019 10:18 AM) General Present- Weight Gain. Not Present-  Appetite Loss, Chills, Fatigue, Fever, Night Sweats and Weight Loss. Skin Present- Dryness. Not Present- Change in Wart/Mole, Hives, Jaundice, New Lesions, Non-Healing Wounds, Rash and Ulcer. HEENT Present- Ringing in the Ears and Seasonal Allergies. Not Present- Earache, Hearing Loss, Hoarseness, Nose Bleed, Oral Ulcers, Sinus Pain, Sore Throat, Visual Disturbances, Wears glasses/contact lenses and Yellow Eyes. Respiratory Present- Snoring. Not Present- Bloody sputum, Chronic Cough, Difficulty Breathing and Wheezing. Cardiovascular Not Present- Chest Pain, Difficulty Breathing Lying Down, Leg Cramps, Palpitations, Rapid Heart Rate, Shortness of Breath and Swelling of Extremities. Gastrointestinal Not Present- Abdominal Pain, Bloating, Bloody Stool, Change in Bowel Habits, Chronic diarrhea, Constipation, Difficulty Swallowing, Excessive gas, Gets full quickly at meals, Hemorrhoids, Indigestion, Nausea, Rectal Pain and Vomiting.  Vitals (Tanisha A. Brown RMA; 06/20/2019 10:20 AM) 06/20/2019 10:20 AM Weight: 223.2 lb Height: 74in Body Surface Area: 2.28 m Body Mass Index: 28.66 kg/m  Temp.: 98.56F  Pulse: 103 (Regular)  BP: 138/84 (Sitting, Left Arm, Standard)        Physical Exam Leighton Ruff MD; 4/80/1655 10:41 AM)  General Mental Status-Alert. General Appearance-Cooperative.  Abdomen Palpation/Percussion Palpation and Percussion of the abdomen reveal - Soft and Non Tender. Note: no L ing hernia noted.  Rectal Anorectal Exam External - normal external exam. Internal - Note: anal papilla, post midline.    Assessment & Plan Leighton Ruff MD; 3/74/8270 10:44 AM)  Natalia Leatherwood ANAL PAPILLA (K62.89) Impression: 64 year old male who presents to the office for evaluation of anal dysplasia seen on anal papilla during colonoscopy. Patient does report some discomfort with bowel movements. We have discussed the benign nature of anal papilla. Given that he is  symptomatic, I have recommended excision. We discussed typical operative procedure and postoperative pain and recovery time. All questions were answered.

## 2019-06-22 ENCOUNTER — Encounter (HOSPITAL_BASED_OUTPATIENT_CLINIC_OR_DEPARTMENT_OTHER): Payer: Self-pay | Admitting: General Surgery

## 2019-06-27 ENCOUNTER — Encounter (HOSPITAL_BASED_OUTPATIENT_CLINIC_OR_DEPARTMENT_OTHER): Payer: Self-pay | Admitting: General Surgery

## 2019-06-27 ENCOUNTER — Other Ambulatory Visit (HOSPITAL_COMMUNITY)
Admission: RE | Admit: 2019-06-27 | Discharge: 2019-06-27 | Disposition: A | Discharge status: Home / Self Care | Payer: Medicare Other | Source: Ambulatory Visit | Attending: General Surgery | Admitting: General Surgery

## 2019-06-27 ENCOUNTER — Other Ambulatory Visit: Payer: Self-pay

## 2019-06-27 DIAGNOSIS — Z79891 Long term (current) use of opiate analgesic: Secondary | ICD-10-CM | POA: Diagnosis not present

## 2019-06-27 DIAGNOSIS — K6289 Other specified diseases of anus and rectum: Secondary | ICD-10-CM | POA: Diagnosis present

## 2019-06-27 DIAGNOSIS — G4730 Sleep apnea, unspecified: Secondary | ICD-10-CM | POA: Diagnosis not present

## 2019-06-27 DIAGNOSIS — N400 Enlarged prostate without lower urinary tract symptoms: Secondary | ICD-10-CM | POA: Diagnosis not present

## 2019-06-27 DIAGNOSIS — Z79899 Other long term (current) drug therapy: Secondary | ICD-10-CM | POA: Diagnosis not present

## 2019-06-27 DIAGNOSIS — K219 Gastro-esophageal reflux disease without esophagitis: Secondary | ICD-10-CM | POA: Diagnosis not present

## 2019-06-27 DIAGNOSIS — K6282 Dysplasia of anus: Secondary | ICD-10-CM | POA: Diagnosis not present

## 2019-06-27 DIAGNOSIS — Z87891 Personal history of nicotine dependence: Secondary | ICD-10-CM | POA: Diagnosis not present

## 2019-06-27 DIAGNOSIS — F329 Major depressive disorder, single episode, unspecified: Secondary | ICD-10-CM | POA: Diagnosis not present

## 2019-06-27 DIAGNOSIS — Z20822 Contact with and (suspected) exposure to covid-19: Secondary | ICD-10-CM | POA: Diagnosis not present

## 2019-06-27 LAB — SARS CORONAVIRUS 2 (TAT 6-24 HRS): SARS Coronavirus 2: NEGATIVE

## 2019-06-27 NOTE — Progress Notes (Signed)
Spoke w/ via phone for pre-op interview---[facility] needs dos---none-             COVID test ------06-27-2019 at 1220 pm Arrive at -------1030 am 06-30-2019 NPO after ------midnight Medications to take morning of surgery -----pantaprazole, oxycontin prn Diabetic medication -----n/a Patient Special Instructions -----none Pre-Op special Istructions -----none Patient verbalized understanding of instructions that were given at this phone interview. Patient denies shortness of breath, chest pain, fever, cough a this phone interview.

## 2019-06-30 ENCOUNTER — Encounter (HOSPITAL_BASED_OUTPATIENT_CLINIC_OR_DEPARTMENT_OTHER): Payer: Self-pay | Admitting: General Surgery

## 2019-06-30 ENCOUNTER — Ambulatory Visit (HOSPITAL_BASED_OUTPATIENT_CLINIC_OR_DEPARTMENT_OTHER): Payer: Medicare Other | Admitting: Anesthesiology

## 2019-06-30 ENCOUNTER — Ambulatory Visit (HOSPITAL_BASED_OUTPATIENT_CLINIC_OR_DEPARTMENT_OTHER)
Admission: RE | Admit: 2019-06-30 | Discharge: 2019-06-30 | Disposition: A | Discharge status: Home / Self Care | Payer: Medicare Other | Attending: General Surgery | Admitting: General Surgery

## 2019-06-30 ENCOUNTER — Other Ambulatory Visit: Payer: Self-pay

## 2019-06-30 ENCOUNTER — Encounter (HOSPITAL_BASED_OUTPATIENT_CLINIC_OR_DEPARTMENT_OTHER)
Admission: RE | Disposition: A | Discharge status: Home / Self Care | Payer: Self-pay | Source: Home / Self Care | Attending: General Surgery

## 2019-06-30 DIAGNOSIS — N400 Enlarged prostate without lower urinary tract symptoms: Secondary | ICD-10-CM | POA: Insufficient documentation

## 2019-06-30 DIAGNOSIS — Z20822 Contact with and (suspected) exposure to covid-19: Secondary | ICD-10-CM | POA: Diagnosis not present

## 2019-06-30 DIAGNOSIS — F329 Major depressive disorder, single episode, unspecified: Secondary | ICD-10-CM | POA: Insufficient documentation

## 2019-06-30 DIAGNOSIS — K6282 Dysplasia of anus: Secondary | ICD-10-CM | POA: Diagnosis not present

## 2019-06-30 DIAGNOSIS — K219 Gastro-esophageal reflux disease without esophagitis: Secondary | ICD-10-CM | POA: Insufficient documentation

## 2019-06-30 DIAGNOSIS — G4730 Sleep apnea, unspecified: Secondary | ICD-10-CM | POA: Diagnosis not present

## 2019-06-30 DIAGNOSIS — Z87891 Personal history of nicotine dependence: Secondary | ICD-10-CM | POA: Insufficient documentation

## 2019-06-30 DIAGNOSIS — K6289 Other specified diseases of anus and rectum: Secondary | ICD-10-CM | POA: Diagnosis not present

## 2019-06-30 DIAGNOSIS — Z79891 Long term (current) use of opiate analgesic: Secondary | ICD-10-CM | POA: Insufficient documentation

## 2019-06-30 DIAGNOSIS — Z79899 Other long term (current) drug therapy: Secondary | ICD-10-CM | POA: Insufficient documentation

## 2019-06-30 HISTORY — DX: Abrasion, right foot, initial encounter: S90.811A

## 2019-06-30 HISTORY — PX: RECTAL EXAM UNDER ANESTHESIA: SHX6399

## 2019-06-30 HISTORY — PX: MASS EXCISION: SHX2000

## 2019-06-30 HISTORY — DX: Obstructive sleep apnea (adult) (pediatric): G47.33

## 2019-06-30 SURGERY — EXAM UNDER ANESTHESIA, RECTUM
Anesthesia: Monitor Anesthesia Care | Site: Rectum

## 2019-06-30 MED ORDER — ACETAMINOPHEN 500 MG PO TABS
ORAL_TABLET | ORAL | Status: AC
  Filled 2019-06-30: qty 1

## 2019-06-30 MED ORDER — LACTATED RINGERS IV SOLN
INTRAVENOUS | Status: DC
  Administered 2019-06-30: 50 mL/h via INTRAVENOUS
  Filled 2019-06-30: qty 1000

## 2019-06-30 MED ORDER — SODIUM CHLORIDE 0.9% FLUSH
3.0000 mL | Freq: Two times a day (BID) | INTRAVENOUS | Status: DC
  Filled 2019-06-30: qty 3

## 2019-06-30 MED ORDER — ACETAMINOPHEN 500 MG PO TABS
500.0000 mg | ORAL_TABLET | Freq: Once | ORAL | Status: AC
  Administered 2019-06-30: 500 mg via ORAL
  Filled 2019-06-30: qty 1

## 2019-06-30 MED ORDER — PROPOFOL 500 MG/50ML IV EMUL
INTRAVENOUS | Status: DC | PRN
  Administered 2019-06-30: 75 ug/kg/min via INTRAVENOUS

## 2019-06-30 MED ORDER — MIDAZOLAM HCL 2 MG/2ML IJ SOLN
INTRAMUSCULAR | Status: AC
  Filled 2019-06-30: qty 2

## 2019-06-30 MED ORDER — PROPOFOL 10 MG/ML IV BOLUS
INTRAVENOUS | Status: DC | PRN
  Administered 2019-06-30: 40 mg via INTRAVENOUS

## 2019-06-30 MED ORDER — FENTANYL CITRATE (PF) 100 MCG/2ML IJ SOLN
25.0000 ug | INTRAMUSCULAR | Status: DC | PRN
  Filled 2019-06-30: qty 1

## 2019-06-30 MED ORDER — MIDAZOLAM HCL 5 MG/5ML IJ SOLN
INTRAMUSCULAR | Status: DC | PRN
  Administered 2019-06-30: 2 mg via INTRAVENOUS

## 2019-06-30 MED ORDER — BUPIVACAINE-EPINEPHRINE 0.5% -1:200000 IJ SOLN
INTRAMUSCULAR | Status: DC | PRN
  Administered 2019-06-30: 40 mL

## 2019-06-30 MED ORDER — ACETAMINOPHEN 500 MG PO TABS
1000.0000 mg | ORAL_TABLET | ORAL | Status: DC
  Filled 2019-06-30: qty 2

## 2019-06-30 SURGICAL SUPPLY — 46 items
BENZOIN TINCTURE PRP APPL 2/3 (GAUZE/BANDAGES/DRESSINGS) ×6 IMPLANT
BLADE HEX COATED 2.75 (ELECTRODE) ×3 IMPLANT
BLADE SURG 10 STRL SS (BLADE) ×3 IMPLANT
BRIEF STRETCH FOR OB PAD LRG (UNDERPADS AND DIAPERS) ×3 IMPLANT
CANISTER SUCT 3000ML PPV (MISCELLANEOUS) ×3 IMPLANT
CHLORAPREP W/TINT 26 (MISCELLANEOUS) ×3 IMPLANT
COVER BACK TABLE 60X90IN (DRAPES) ×3 IMPLANT
COVER MAYO STAND STRL (DRAPES) ×3 IMPLANT
COVER WAND RF STERILE (DRAPES) ×3 IMPLANT
DECANTER SPIKE VIAL GLASS SM (MISCELLANEOUS) ×3 IMPLANT
DRAPE LAPAROTOMY 100X72 PEDS (DRAPES) ×3 IMPLANT
DRAPE UTILITY XL STRL (DRAPES) ×3 IMPLANT
DRSG PAD ABDOMINAL 8X10 ST (GAUZE/BANDAGES/DRESSINGS) ×3 IMPLANT
ELECT REM PT RETURN 9FT ADLT (ELECTROSURGICAL) ×3
ELECTRODE REM PT RTRN 9FT ADLT (ELECTROSURGICAL) ×1 IMPLANT
GAUZE SPONGE 4X4 12PLY STRL (GAUZE/BANDAGES/DRESSINGS) ×3 IMPLANT
GAUZE SPONGE 4X4 8PLY STR LF (GAUZE/BANDAGES/DRESSINGS) ×3 IMPLANT
GLOVE BIO SURGEON STRL SZ 6.5 (GLOVE) ×6 IMPLANT
GLOVE BIO SURGEON STRL SZ8 (GLOVE) ×3 IMPLANT
GLOVE BIO SURGEONS STRL SZ 6.5 (GLOVE) ×3
GLOVE BIOGEL PI IND STRL 6.5 (GLOVE) ×1 IMPLANT
GLOVE BIOGEL PI IND STRL 7.0 (GLOVE) ×1 IMPLANT
GLOVE BIOGEL PI INDICATOR 6.5 (GLOVE) ×2
GLOVE BIOGEL PI INDICATOR 7.0 (GLOVE) ×2
GOWN STRL REUS W/ TWL LRG LVL3 (GOWN DISPOSABLE) ×1 IMPLANT
GOWN STRL REUS W/TWL 2XL LVL3 (GOWN DISPOSABLE) ×3 IMPLANT
GOWN STRL REUS W/TWL LRG LVL3 (GOWN DISPOSABLE) ×3
GOWN STRL REUS W/TWL XL LVL3 (GOWN DISPOSABLE) ×6 IMPLANT
HYDROGEN PEROXIDE 16OZ (MISCELLANEOUS) ×3 IMPLANT
IV CATH 14GX2 1/4 (CATHETERS) ×3 IMPLANT
IV CATH 18G SAFETY (IV SOLUTION) ×3 IMPLANT
KIT TURNOVER CYSTO (KITS) ×3 IMPLANT
NEEDLE HYPO 22GX1.5 SAFETY (NEEDLE) ×3 IMPLANT
NS IRRIG 500ML POUR BTL (IV SOLUTION) ×3 IMPLANT
PACK BASIN DAY SURGERY FS (CUSTOM PROCEDURE TRAY) ×3 IMPLANT
PENCIL BUTTON HOLSTER BLD 10FT (ELECTRODE) ×3 IMPLANT
SUT CHROMIC 3 0 SH 27 (SUTURE) ×3 IMPLANT
SYR BULB IRRIGATION 50ML (SYRINGE) ×3 IMPLANT
SYR CONTROL 10ML LL (SYRINGE) ×3 IMPLANT
TOWEL OR 17X26 10 PK STRL BLUE (TOWEL DISPOSABLE) ×6 IMPLANT
TRAY DSU PREP LF (CUSTOM PROCEDURE TRAY) ×3 IMPLANT
TUBE CONNECTING 12'X1/4 (SUCTIONS) ×1
TUBE CONNECTING 12X1/4 (SUCTIONS) ×2 IMPLANT
UNDERPAD 30X30 (UNDERPADS AND DIAPERS) ×3 IMPLANT
WATER STERILE IRR 500ML POUR (IV SOLUTION) ×3 IMPLANT
YANKAUER SUCT BULB TIP NO VENT (SUCTIONS) ×3 IMPLANT

## 2019-06-30 NOTE — Op Note (Signed)
06/30/2019  2:27 PM  PATIENT:  Micheal [name]  64 y.o. male  Patient Care Team: Chesley Noon, MD as PCP - General (Family Medicine)  PRE-OPERATIVE DIAGNOSIS:  ANAL PAPILLA  POST-OPERATIVE DIAGNOSIS:  ANAL PAPILLA, ANAL CANAL LESION  PROCEDURE:   ANORECTAL EXAM UNDER ANESTHESIA EXCISION OF ANAL PAPILLA, EXCISION OF ANAL CANAL LESION    Surgeon(s): Leighton Ruff, MD  ASSISTANT: none   ANESTHESIA:   local and MAC  SPECIMEN:  Source of Specimen:  anal papilla, anal canal lesion (posterior)  DISPOSITION OF SPECIMEN:  PATHOLOGY  COUNTS:  YES  PLAN OF CARE: Discharge to home after PACU  PATIENT DISPOSITION:  PACU - hemodynamically stable.  INDICATION: 64 y.o. M with AIN seen on biopsy of anal papilla   OR FINDINGS: Posterior midline hypertrophied anal papilla at the dentate line.  Distally there was a small perianal lesion which was raised.  DESCRIPTION: the patient was identified in the preoperative holding area and taken to the OR where they were laid on the operating room table.  MAC anesthesia was induced without difficulty. The patient was then positioned in prone jackknife position with buttocks gently taped apart.  The patient was then prepped and draped in usual sterile fashion.  SCDs were noted to be in place prior to the initiation of anesthesia. A surgical timeout was performed indicating the correct patient, procedure, positioning and need for preoperative antibiotics.  A rectal block was performed using Marcaine with epinephrine.    I began with a digital rectal exam.  There were no abnormalities noted.  I then placed a Hill-Ferguson anoscope into the anal canal and evaluated this completely.  I identified the anal papilla at posterior midline.  This was excised using electrocautery.  The defect was closed using a 3-0 chromic suture.  Distal to this there was a small raised lesion at posterior midline.  This was excised using Metzenbaum scissors and the anoderm was  reapproximated using a 3-0 chromic suture.  The patient tolerated this well.  All counts were correct.  He was awakened from anesthesia and sent to the postanesthesia care unit in stable condition.

## 2019-06-30 NOTE — Anesthesia Procedure Notes (Signed)
Procedure Name: MAC Date/Time: 06/30/2019 1:53 PM Performed by: Bonney Aid, CRNA Pre-anesthesia Checklist: Patient identified, Timeout performed, Emergency Drugs available, Suction available and Patient being monitored Patient Re-evaluated:Patient Re-evaluated prior to induction Oxygen Delivery Method: Nasal cannula Placement Confirmation: positive ETCO2

## 2019-06-30 NOTE — Transfer of Care (Signed)
Immediate Anesthesia Transfer of Care Note  Patient: Micheal [name]  Procedure(s) Performed: ANORECTAL EXAM UNDER ANESTHESIA (N/A Rectum) EXCISION OF ANAL PAPILLA, EXCISION OF ANAL CANAL LESION (N/A Rectum)  Patient Location: PACU  Anesthesia Type:General  Level of Consciousness: awake, alert  and oriented  Airway & Oxygen Therapy: Patient Spontanous Breathing  Post-op Assessment: Report given to RN  Post vital signs: Reviewed and stable  Last Vitals:  Vitals Value Taken Time  BP 129/77 06/30/19 1418  Temp    Pulse 75 06/30/19 1418  Resp 15 06/30/19 1418  SpO2 95 % 06/30/19 1418  Vitals shown include unvalidated device data.  Last Pain:  Vitals:   06/30/19 1050  TempSrc: Oral  PainSc: 0-No pain      Patients Stated Pain Goal: 4 (81/[id number]/86 [id number])  Complications: No apparent anesthesia complications

## 2019-06-30 NOTE — Discharge Instructions (Addendum)
Beginning the day after surgery:  You may sit in a tub of warm water 2-3 times a day to relieve discomfort.  Eat a regular diet high in fiber.  Avoid foods that give you constipation or diarrhea.  Avoid foods that are difficult to digest, such as seeds, nuts, corn or popcorn.  Do not go any longer than 2 days without a bowel movement.  You may take a dose of Milk of Magnesia if you become constipated.    Drink 6-8 glasses of water daily.  Walking is encouraged.  Avoid strenuous activity and heavy lifting for one month after surgery.    Call the office if you have any questions or concerns.  Call immediately if you develop:   Excessive rectal bleeding (more than a cup or passing large clots)  Increased discomfort  Fever greater than 100 F  Difficulty urinating   Post Anesthesia Home Care Instructions  Activity: Get plenty of rest for the remainder of the day. A responsible individual must stay with you for 24 hours following the procedure.  For the next 24 hours, DO NOT: -Drive a car -Paediatric nurse -Drink alcoholic beverages -Take any medication unless instructed by your physician -Make any legal decisions or sign important papers.  Meals: Start with liquid foods such as gelatin or soup. Progress to regular foods as tolerated. Avoid greasy, spicy, heavy foods. If nausea and/or vomiting occur, drink only clear liquids until the nausea and/or vomiting subsides. Call your physician if vomiting continues.  Special Instructions/Symptoms: Your throat may feel dry or sore from the anesthesia or the breathing tube placed in your throat during surgery. If this causes discomfort, gargle with warm salt water. The discomfort should disappear within 24 hours.

## 2019-06-30 NOTE — Interval H&P Note (Signed)
History and Physical Interval Note:  06/30/2019 10:23 AM  Micheal [name]  has presented today for surgery, with the diagnosis of ANAL PAPILLA.  The various methods of treatment have been discussed with the patient and family. After consideration of risks, benefits and other options for treatment, the patient has consented to  Procedure(s): ANORECTAL EXAM UNDER ANESTHESIA (N/A) EXCISION ANAL PAPILLA (N/A) as a surgical intervention.  The patient's history has been reviewed, patient examined, no change in status, stable for surgery.  I have reviewed the patient's chart and labs.  Questions were answered to the patient's satisfaction.     Rosario Adie, MD  Colorectal and [facility]ham Surgery

## 2019-06-30 NOTE — Anesthesia Preprocedure Evaluation (Addendum)
Anesthesia Evaluation  Patient identified by MRN, date of birth, ID band Patient awake    Reviewed: Allergy & Precautions, NPO status , Patient's Chart, lab work & pertinent test results  Airway Mallampati: III  TM Distance: >3 FB Neck ROM: Full    Dental no notable dental hx. (+) Teeth Intact, Dental Advisory Given   Pulmonary sleep apnea , former smoker,    Pulmonary exam normal breath sounds clear to auscultation       Cardiovascular negative cardio ROS Normal cardiovascular exam Rhythm:Regular Rate:Normal     Neuro/Psych PSYCHIATRIC DISORDERS Anxiety negative neurological ROS     GI/Hepatic Neg liver ROS, GERD  Medicated and Controlled,H/o crohn's   Endo/Other  negative endocrine ROS  Renal/GU negative Renal ROS  negative genitourinary   Musculoskeletal  (+) Arthritis ,   Abdominal   Peds  Hematology negative hematology ROS (+)   Anesthesia Other Findings   Reproductive/Obstetrics                            Anesthesia Physical Anesthesia Plan  ASA: III  Anesthesia Plan: MAC   Post-op Pain Management:    Induction: Intravenous  PONV Risk Score and Plan: 1 and Propofol infusion, Treatment may vary due to age or medical condition, Midazolam and Ondansetron  Airway Management Planned: Natural Airway  Additional Equipment:   Intra-op Plan:   Post-operative Plan:   Informed Consent: I have reviewed the patients History and Physical, chart, labs and discussed the procedure including the risks, benefits and alternatives for the proposed anesthesia with the patient or authorized representative who has indicated his/her understanding and acceptance.     Dental advisory given  Plan Discussed with: CRNA  Anesthesia Plan Comments:         Anesthesia Quick Evaluation

## 2019-07-04 LAB — SURGICAL PATHOLOGY

## 2019-07-04 NOTE — Anesthesia Postprocedure Evaluation (Signed)
Anesthesia Post Note  Patient: Micheal [name]  Procedure(s) Performed: ANORECTAL EXAM UNDER ANESTHESIA (N/A Rectum) EXCISION OF ANAL PAPILLA, EXCISION OF ANAL CANAL LESION (N/A Rectum)     Patient location during evaluation: PACU Anesthesia Type: MAC Level of consciousness: awake and alert Pain management: pain level controlled Vital Signs Assessment: post-procedure vital signs reviewed and stable Respiratory status: spontaneous breathing, nonlabored ventilation, respiratory function stable and patient connected to nasal cannula oxygen Cardiovascular status: stable and blood pressure returned to baseline Postop Assessment: no apparent nausea or vomiting Anesthetic complications: no    Last Vitals:  Vitals:   06/30/19 1445 06/30/19 1500  BP: 122/77 (!) 143/86  Pulse: (!) 59 68  Resp: 13 16  Temp:  36.6 C  SpO2: 91% 98%    Last Pain:  Vitals:   07/03/19 1010  TempSrc:   PainSc: 4    Pain Goal: Patients Stated Pain Goal: 4 (06/30/19 1050)                 Chritopher Coster L Sneha Willig

## 2019-09-08 ENCOUNTER — Telehealth: Payer: Self-pay

## 2019-09-08 MED ORDER — DICYCLOMINE HCL 10 MG PO CAPS: 10.0000 mg | ORAL_CAPSULE | Freq: Three times a day (TID) | ORAL | 3 refills | Status: DC | PRN

## 2019-09-08 NOTE — Telephone Encounter (Signed)
rec'd refill request from pharmacy for bentyl. At last OV pt indicated it didn't help. Called and spoke to pt. He said that once he stated taking it it relaxed everything and then it DID help his stomach.  He requested a refill. Script sent.

## 2020-03-20 ENCOUNTER — Telehealth: Payer: Self-pay | Admitting: Gastroenterology

## 2020-03-20 MED ORDER — PANTOPRAZOLE SODIUM 20 MG PO TBEC: DELAYED_RELEASE_TABLET | ORAL | 1 refills | Status: DC

## 2020-03-20 NOTE — Telephone Encounter (Signed)
Script sent but pt needs an appt for acid reflux. Last seen for GERD in  05-2018

## 2020-03-25 ENCOUNTER — Telehealth: Payer: Self-pay | Admitting: Gastroenterology

## 2020-03-25 NOTE — Telephone Encounter (Signed)
60 day supply of Protonix was sent to pharmacy on 03-20-20. Patient will have enough to get to appt on 05-10-20.

## 2020-03-26 MED ORDER — PANTOPRAZOLE SODIUM 20 MG PO TBEC: 20.0000 mg | DELAYED_RELEASE_TABLET | Freq: Every day | ORAL | 1 refills | Status: DC

## 2020-03-26 NOTE — Telephone Encounter (Signed)
New prescription sent to pharmacy.  Called and spoke to pharmacy. They still can't see script. Script was take over the phone. The indicated they will inform patient. Called and informed patient.

## 2020-03-26 NOTE — Telephone Encounter (Signed)
Inbound call from patient stating he went to the pharmacy today and they told him they have not received Protonix prescription.  Please advise.

## 2020-03-26 NOTE — Addendum Note (Signed)
Addended by: Roetta Sessions on: 03/26/2020 05:04 PM   Modules accepted: Orders

## 2020-05-10 ENCOUNTER — Telehealth (INDEPENDENT_AMBULATORY_CARE_PROVIDER_SITE_OTHER): Payer: Medicare Other | Admitting: Gastroenterology

## 2020-05-10 ENCOUNTER — Encounter: Payer: Self-pay | Admitting: Gastroenterology

## 2020-05-10 ENCOUNTER — Other Ambulatory Visit: Payer: Self-pay

## 2020-05-10 DIAGNOSIS — R1032 Left lower quadrant pain: Secondary | ICD-10-CM

## 2020-05-10 DIAGNOSIS — K219 Gastro-esophageal reflux disease without esophagitis: Secondary | ICD-10-CM | POA: Diagnosis not present

## 2020-05-10 DIAGNOSIS — Z79899 Other long term (current) drug therapy: Secondary | ICD-10-CM | POA: Diagnosis not present

## 2020-05-10 MED ORDER — PANTOPRAZOLE SODIUM 20 MG PO TBEC: 20.0000 mg | DELAYED_RELEASE_TABLET | Freq: Every day | ORAL | 1 refills | Status: DC

## 2020-05-10 NOTE — Progress Notes (Signed)
THIS ENCOUNTER IS A VIRTUAL VISIT DUE TO COVID-19 - PATIENT WAS NOT SEEN IN THE OFFICE. PATIENT HAS CONSENTED TO VIRTUAL VISIT / TELEMEDICINE VISIT USING MYCHART APP WITH VISUAL AND AUDIO CAPABILITY   Location of patient: home Location of provider: office Persons participating: myself, patient   HPI :  65 y/o male here for a follow up visit for GERD. He has also has a history of Crohn's disease and diverticulitis.   Crohn's history: Diagnosed with Crohns disease when he had ileocolonic resection in the 1990s or so for abdominal pain. Was on a sulfasalazine for a period of time, but mostly not on any maintenance therapy for years. He has not had any overt recurrence of disease. Colonoscopy in 2013and in 2018in which there was no evidence of active Crohns at the surgical anastomosis. He has hadsevere diverticulosis leading to resection of that in the past.   Since the last visit: Since have last seen him in the office he had a colonoscopy with me in February of last year.  There is no active evidence of IBD in his colon or small intestine.  He had healthy surgical anastomosis in the sigmoid colon and if you benign colon polyps.  He had hypertrophied anal papilla which was biopsied showing low-grade AIN and was referred to colorectal surgery for further evaluation.  At the time of his last colonoscopy he had been experiencing left lower quadrant pain which had been persistent and etiology was unclear.  He had no evidence of diverticulitis on CT scan.  His colonoscopy did not show a clear cause.  He was treated with Bentyl initially and that did not improve.  We suspected this could have been musculoskeletal, just resolved over time and he states it has not recurred.  He is feeling well in this regard.  Bowel problems at this time.  No diarrhea or constipation.  No blood in stools.  He has been on Protonix 20 mg daily for several years for treatment of reflux and epigastric burning.  He states  if he does not take it and comes off that he will have symptoms that bother him.  He previously had some dietary triggers as well as alcohol which could flare it however he is avoiding alcohol and other triggers which helps this.  He denies any dysphagia.  He is never had a prior EGD we discussed if he wanted screening for Barrett's esophagus.  We also discussed the long-term risks of chronic PPI use.  He appears to have tolerated the PPIs well so far.  No history of renal disease.   Colonoscopy 05/18/2019 - The perianal and digital rectal examinations were normal. - The terminal ileum appeared normal. - There was evidence of a prior end-to-side ileo-colonic anastomosis in the ascending colon. This was patent and was characterized by healthy appearing mucosa. - There was evidence of a prior end-to-end colo-colonic anastomosis in the sigmoid colon. This was patent and was characterized by healthy appearing mucosa. - Two sessile polyps were found in the ascending colon. The polyps were 4 mm in size. These polyps were removed with a cold snare. Resection and retrieval were complete. - Scattered medium-mouthed diverticula were found in the transverse colon and left colon. - Anal papilla(e) were hypertrophied. Biopsies were taken with a cold forceps for histology to rule out AIN. - The exam was otherwise without abnormality.  1. Surgical [P], colon, ascending, polyp (2) - BENIGN COLONIC MUCOSA (3 OF 3 FRAGMENTS) - NO HIGH GRADE DYSPLASIA OR  MALIGNANCY IDENTIFIED - SEE COMMENT 2. Surgical [P], colon, anal canal - LOW GRADE SQUAMOUS INTRAEPITHELIAL LESION (AIN 1; LGSIL) - SEE COMMENT    Past Medical History:  Diagnosis Date  . Abrasion of right heel    healing has scab per pt  . Adenomatous colon polyp   . Anal fissure    pt believes it is 2 inches up  . Anxiety   . Arthritis   . At risk for sleep apnea    STOP-BANG= 5    SENT TO PCP 01-31-2014  . BPH (benign prostatic hypertrophy)   .  Chronic lower back pain   . Crohn's disease ([place])    no recurrence  . Diverticulosis of colon   . GERD (gastroesophageal reflux disease)   . Hepatic steatosis   . History of colon polyps    tubular adenoma  . History of diverticulitis of colon    sigmoid-  s/p resection  . History of gastric ulcer   . Inguinal hernia   . OSA (obstructive sleep apnea)    could not tolerate cpap, took cpap back     Past Surgical History:  Procedure Laterality Date  . EXCISIONAL HEMORRHOIDECTOMY  age 27  . INCISIONAL HERNIA REPAIR  2000   and Left Inguinal Hernia repair  . KNEE ARTHROSCOPY Bilateral X4  last one  2011  . LAPAROSCOPIC PARTIAL COLECTOMY N/A 06/09/2012   Procedure: LAPAROSCOPIC assisted PARTIAL COLECTOMY open ;  Surgeon: Odis Hollingshead, MD;  Location: [facility];  Service: General;  Laterality: N/A;  diverticulitis  . LUMBAR DISC SURGERY  1994   L4 -- L5  . MASS EXCISION N/A 06/30/2019   Procedure: EXCISION OF ANAL PAPILLA, EXCISION OF ANAL CANAL LESION;  Surgeon: Leighton Ruff, MD;  Location: [facility];  Service: General;  Laterality: N/A;  . POPLITEAL SYNOVIAL CYST EXCISION Bilateral 2001  . RECTAL EXAM UNDER ANESTHESIA N/A 06/30/2019   Procedure: ANORECTAL EXAM UNDER ANESTHESIA;  Surgeon: Leighton Ruff, MD;  Location: [place];  Service: General;  Laterality: N/A;  . [place]   and Appendectomy  for CROHN'S  . TONSILLECTOMY AND ADENOIDECTOMY  as child  . TRANSURETHRAL RESECTION OF PROSTATE N/A 02/05/2014   Procedure: TRANSURETHRAL RESECTION OF THE PROSTATE WITH GYRUS INSTRUMENTS;  Surgeon: Bernestine Amass, MD;  Location: [facility];  Service: Urology;  Laterality: N/A;   Family History  Problem Relation Age of Onset  . Heart disease Father   . Cirrhosis Mother        alcohol induced  . Colon cancer Neg Hx   . Esophageal cancer Neg Hx   . Rectal cancer Neg Hx   . Stomach cancer Neg Hx    Social History    Tobacco Use  . Smoking status: Former Smoker    Types: Cigars    Quit date: 02/02/2004    Years since quitting: 16.2  . Smokeless tobacco: Never Used  Vaping Use  . Vaping Use: Never used  Substance Use Topics  . Alcohol use: No  . Drug use: Not Currently    Types: Marijuana   Current Outpatient Medications  Medication Sig Dispense Refill  . dicyclomine (BENTYL) 10 MG capsule Take 1 capsule (10 mg total) by mouth every 8 (eight) hours as needed for spasms. 60 capsule 3  . FLUoxetine (PROZAC) 10 MG tablet Take 20 mg by mouth at bedtime.    . gabapentin (NEURONTIN) 300 MG capsule 600 mg at bedtime.  1  . oxyCODONE-acetaminophen (PERCOCET) 10-325 MG per tablet Take 1 tablet by mouth every 4 (four) hours as needed for pain.    . pantoprazole (PROTONIX) 20 MG tablet Take 1 tablet (20 mg total) by mouth daily. Please keep your upcoming appt for further refills. Thank you 30 tablet 1  . Tamsulosin HCl (FLOMAX) 0.4 MG CAPS Take 0.4 mg by mouth in the morning and at bedtime.     Current Facility-Administered Medications  Medication Dose Route Frequency Provider Last Rate Last Admin  . 0.9 %  sodium chloride infusion  500 mL Intravenous Once Mohamadou Maciver, Carlota Raspberry, MD       Allergies  Allergen Reactions  . Codeine Itching     Review of Systems: All systems reviewed and negative except where noted in HPI.    Lab Results  Component Value Date   WBC 11.0 (H) 03/27/2019   HGB 14.9 03/27/2019   HCT 45.5 03/27/2019   MCV 88.6 03/27/2019   PLT 270.0 03/27/2019    Lab Results  Component Value Date   CREATININE 0.89 03/27/2019   BUN 7 03/27/2019   NA 137 03/27/2019   K 3.8 03/27/2019   CL 101 03/27/2019   CO2 28 03/27/2019    Lab Results  Component Value Date   ALT 35 03/27/2019   AST 32 03/27/2019   ALKPHOS 52 03/27/2019   BILITOT 0.5 03/27/2019    Physical Exam: There were no vitals taken for this visit.  NA   ASSESSMENT AND PLAN: 65 year old male here for  reassessment of the following issues:  GERD Long-term use of proton pump inhibitor therapy Left lower quadrant pain  Longstanding reflux symptoms, has been dependent on low-dose Protonix to control this for some time, weaning off has led to recurrent symptoms.  He has no alarm symptoms and otherwise is doing pretty well.  We discussed long-term use of chronic PPI therapy.  This can increase risk for chronic kidney disease, increased risk for bone fracture, increased risk for C. difficile, increased risk for vitamin deficiencies, small increased risk for gastric cancer, etc. He understands the risks but given this controls his symptoms well and he tolerates it he wishes to continue it for now.  Fortunately low-dose Protonix is enough to control his symptoms.  I otherwise discussed if he wanted to have a screening endoscopy for Barrett's esophagus.  Given his longstanding symptoms, age, ethnicity, gender, he meets criteria for Barrett's screening.  I discussed EGD and anesthesia with him and following discussion of risks and benefits he wanted to proceed.  Further recommendations pending results.  Otherwise his left lower quadrant pain has resolved and no longer bothering him, suspect this may have been musculoskeletal given the work-up he had in nature symptoms described, he can contact me if this recurs.  Haverhill Cellar, MD [facility]

## 2020-05-10 NOTE — Patient Instructions (Addendum)
If you are age 66 or older, your body mass index should be between 23-30. Your There is no height or weight on file to calculate BMI. If this is out of the aforementioned range listed, please consider follow up with your Primary Care Provider.  If you are age 43 or younger, your body mass index should be between 19-25. Your There is no height or weight on file to calculate BMI. If this is out of the aformentioned range listed, please consider follow up with your Primary Care Provider.   You have been scheduled for an endoscopy. Please follow written instructions given to you at your visit today. If you use inhalers (even only as needed), please bring them with you on the day of your procedure.  Instructions have been mailed to you and sent to [facility]. Please read carefully and let me know if you have any questions.   We have sent the following medications to your pharmacy for you to pick up at your convenience: Protonix  Thank you for entrusting me with your care and for choosing [facility], Dr. Merrill Cellar

## 2020-06-06 ENCOUNTER — Ambulatory Visit (AMBULATORY_SURGERY_CENTER): Payer: Medicare Other | Admitting: Gastroenterology

## 2020-06-06 ENCOUNTER — Other Ambulatory Visit: Payer: Self-pay

## 2020-06-06 ENCOUNTER — Encounter: Payer: Self-pay | Admitting: Gastroenterology

## 2020-06-06 VITALS — BP 136/72 | HR 64 | Temp 98.3°F | Resp 12 | Ht 74.0 in | Wt 212.0 lb

## 2020-06-06 DIAGNOSIS — K2970 Gastritis, unspecified, without bleeding: Secondary | ICD-10-CM | POA: Diagnosis not present

## 2020-06-06 DIAGNOSIS — K317 Polyp of stomach and duodenum: Secondary | ICD-10-CM

## 2020-06-06 DIAGNOSIS — K219 Gastro-esophageal reflux disease without esophagitis: Secondary | ICD-10-CM | POA: Diagnosis not present

## 2020-06-06 DIAGNOSIS — B9681 Helicobacter pylori [H. pylori] as the cause of diseases classified elsewhere: Secondary | ICD-10-CM

## 2020-06-06 DIAGNOSIS — K2950 Unspecified chronic gastritis without bleeding: Secondary | ICD-10-CM | POA: Diagnosis not present

## 2020-06-06 MED ORDER — SODIUM CHLORIDE 0.9 % IV SOLN: 500.0000 mL | Freq: Once | INTRAVENOUS | Status: DC

## 2020-06-06 NOTE — Patient Instructions (Signed)
YOU HAD AN ENDOSCOPIC PROCEDURE TODAY AT THE [facility]:   Refer to the procedure report that was given to you for any specific questions about what was found during the examination.  If the procedure report does not answer your questions, please call your gastroenterologist to clarify.  If you requested that your care partner not be given the details of your procedure findings, then the procedure report has been included in a sealed envelope for you to review at your convenience later.  YOU SHOULD EXPECT: Some feelings of bloating in the abdomen. Passage of more gas than usual.  Walking can help get rid of the air that was put into your GI tract during the procedure and reduce the bloating. If you had a lower endoscopy (such as a colonoscopy or flexible sigmoidoscopy) you may notice spotting of blood in your stool or on the toilet paper. If you underwent a bowel prep for your procedure, you may not have a normal bowel movement for a few days.  Please Note:  You might notice some irritation and congestion in your nose or some drainage.  This is from the oxygen used during your procedure.  There is no need for concern and it should clear up in a day or so.  SYMPTOMS TO REPORT IMMEDIATELY:    Following upper endoscopy (EGD)  Vomiting of blood or coffee ground material  New chest pain or pain under the shoulder blades  Painful or persistently difficult swallowing  New shortness of breath  Fever of 100F or higher  Black, tarry-looking stools  For urgent or emergent issues, a gastroenterologist can be reached at any hour by calling [phone]. Do not use MyChart messaging for urgent concerns.    DIET:  We do recommend a small meal at first, but then you may proceed to your regular diet.  Drink plenty of fluids but you should avoid alcoholic beverages for 24 hours.  ACTIVITY:  You should plan to take it easy for the rest of today and you should NOT DRIVE or use heavy machinery  until tomorrow (because of the sedation medicines used during the test).    FOLLOW UP: Our staff will call the number listed on your records 48-72 hours following your procedure to check on you and address any questions or concerns that you may have regarding the information given to you following your procedure. If we do not reach you, we will leave a message.  We will attempt to reach you two times.  During this call, we will ask if you have developed any symptoms of COVID 19. If you develop any symptoms (ie: fever, flu-like symptoms, shortness of breath, cough etc.) before then, please call [phone].  If you test positive for Covid 19 in the 2 weeks post procedure, please call and report this information to us.    If any biopsies were taken you will be contacted by phone or by letter within the next 1-3 weeks.  Please call us at [phone] if you have not heard about the biopsies in 3 weeks.    SIGNATURES/CONFIDENTIALITY: You and/or your care partner have signed paperwork which will be entered into your electronic medical record.  These signatures attest to the fact that that the information above on your After Visit Summary has been reviewed and is understood.  Full responsibility of the confidentiality of this discharge information lies with you and/or your care-partner. 

## 2020-06-06 NOTE — Op Note (Signed)
[facility] Patient Name: Micheal [name] Procedure Date: 06/06/2020 4:37 PM MRN: [id number] Endoscopist: Remo Lipps P. Havery Moros , MD Age: 65 Referring MD:  Date of Birth: [birth year] Gender: Male Account #: [id number] Procedure:                Upper GI endoscopy Indications:              Screening for Barrett's esophagus, history of GERD,                            well controlled on protonix 63m / day Medicines:                Monitored Anesthesia Care Procedure:                Pre-Anesthesia Assessment:                           - Prior to the procedure, a History and Physical                            was performed, and patient medications and                            allergies were reviewed. The patient's tolerance of                            previous anesthesia was also reviewed. The risks                            and benefits of the procedure and the sedation                            options and risks were discussed with the patient.                            All questions were answered, and informed consent                            was obtained. Prior Anticoagulants: The patient has                            taken no previous anticoagulant or antiplatelet                            agents. ASA Grade Assessment: II - A patient with                            mild systemic disease. After reviewing the risks                            and benefits, the patient was deemed in                            satisfactory condition to undergo the procedure.  After obtaining informed consent, the endoscope was                            passed under direct vision. Throughout the                            procedure, the patient's blood pressure, pulse, and                            oxygen saturations were monitored continuously. The                            Endoscope was introduced through the mouth, and                            advanced to the  second part of duodenum. The upper                            GI endoscopy was accomplished without difficulty.                            The patient tolerated the procedure well. Scope In: Scope Out: Findings:                 Esophagogastric landmarks were identified: the                            Z-line was found at 41 cm, the gastroesophageal                            junction was found at 41 cm and the upper extent of                            the gastric folds was found at 41 cm from the                            incisors.                           The exam of the esophagus was otherwise normal. No                            evidence of Barrett's esophagus.                           Diffuse mildly erythematous mucosa was found in the                            gastric body.                           A single 4 mm sessile polyp was found in the  gastric fundus. The polyp was removed with a cold                            biopsy forceps. Resection and retrieval were                            complete.                           The exam of the stomach was otherwise normal.                           Biopsies were taken with a cold forceps in the                            gastric body, at the incisura and in the gastric                            antrum for Helicobacter pylori testing.                           The duodenal bulb and second portion of the                            duodenum were normal. Complications:            No immediate complications. Estimated blood loss:                            Minimal. Estimated Blood Loss:     Estimated blood loss was minimal. Impression:               - Esophagogastric landmarks identified.                           - Normal esophagus otherwise - no evidence of                            Barrett's esophagus                           - Erythematous mucosa in the gastric body.                           - A  single gastric polyp. Resected and retrieved.                           - Normal stomach otherwise - biopsies taken to rule                            out H pylori                           - Normal duodenal bulb and second portion of the  duodenum. Recommendation:           - Patient has a contact number available for                            emergencies. The signs and symptoms of potential                            delayed complications were discussed with the                            patient. Return to normal activities tomorrow.                            Written discharge instructions were provided to the                            patient.                           - Resume previous diet.                           - Continue present medications.                           - Await pathology results. Remo Lipps P. Kenae Lindquist, MD 06/06/2020 5:00:16 PM This report has been signed electronically.

## 2020-06-06 NOTE — Progress Notes (Signed)
A and O x3. Report to RN. Tolerated MAC anesthesia well..de

## 2020-06-06 NOTE — Progress Notes (Signed)
Called to room to assist during endoscopic procedure.  Patient ID and intended procedure confirmed with present staff. Received instructions for my participation in the procedure from the performing physician.  

## 2020-06-10 ENCOUNTER — Telehealth: Payer: Self-pay | Admitting: *Deleted

## 2020-06-10 NOTE — Telephone Encounter (Signed)
  Follow up Call-  Call back number 06/06/2020 05/18/2019  Post procedure Call Back phone  # [phone]  Permission to leave phone message Yes Yes  Some recent data might be hidden     Patient questions:  Do you have a fever, pain , or abdominal swelling? No. Pain Score  0 *  Have you tolerated food without any problems? Yes.    Have you been able to return to your normal activities? Yes.    Do you have any questions about your discharge instructions: Diet   No. Medications  No. Follow up visit  No.  Do you have questions or concerns about your Care? No.  Actions: * If pain score is 4 or above: No action needed, pain <4.  1. Have you developed a fever since your procedure? no  2.   Have you had an respiratory symptoms (SOB or cough) since your procedure? no  3.   Have you tested positive for COVID 19 since your procedure no  4.   Have you had any family members/close contacts diagnosed with the COVID 19 since your procedure?  no   If yes to any of these questions please route to Joylene John, RN and Joella Prince, RN

## 2020-06-17 ENCOUNTER — Other Ambulatory Visit: Payer: Self-pay

## 2020-06-17 DIAGNOSIS — A048 Other specified bacterial intestinal infections: Secondary | ICD-10-CM

## 2020-06-17 MED ORDER — CLARITHROMYCIN 500 MG PO TABS: 500.0000 mg | ORAL_TABLET | Freq: Two times a day (BID) | ORAL | 0 refills | Status: AC

## 2020-06-17 MED ORDER — METRONIDAZOLE 500 MG PO TABS: 500.0000 mg | ORAL_TABLET | Freq: Two times a day (BID) | ORAL | 0 refills | Status: AC

## 2020-06-17 MED ORDER — AMOXICILLIN 500 MG PO TABS: 1000.0000 mg | ORAL_TABLET | Freq: Two times a day (BID) | ORAL | 0 refills | Status: AC

## 2020-06-24 ENCOUNTER — Telehealth: Payer: Self-pay | Admitting: Gastroenterology

## 2020-06-24 NOTE — Telephone Encounter (Signed)
Diarrhea can be common after antibiotics. Is he currently in the midst of therapy? Can try some immodium, should slow down. If persistent or worsening can send stool for C diff but suspect this is related to his antibiotic use. Thanks

## 2020-06-24 NOTE — Telephone Encounter (Signed)
Inbound call from patient stating he has been experiencing diarrhea after taking the antibiotics that were prescribed to him and is wanting to know if there is anything else he can do.  Please advise.

## 2020-06-24 NOTE — Telephone Encounter (Signed)
Called patient. Left detailed message to try over-the-counter Imodium and let us know if he doesn't see improvement in a day or so.

## 2020-07-03 NOTE — Telephone Encounter (Signed)
Pt is requesting a call back from a nurse to discuss his antibiotics, pt states he has dark stools and would like to know what he can do.

## 2020-07-03 NOTE — Telephone Encounter (Signed)
Pepto likely caused the dark stool. He should hold the Pepto and monitor for now.  We can try some bentyl 32m every 8 hours for the LLQ pain and see if that helps in the interim. Is he still taking the antibiotics for H pylori, or has he finished that? Thanks

## 2020-07-03 NOTE — Telephone Encounter (Signed)
Spoke with patient, he states that he took Imodium AD that helped with diarrhea. Reports waking up with LLQ pain late last night/this morning,  For the fist time he noticed black and tarry stool this morning but he took a gulp of Pepto bismol around 5 am - advised that Pepto can change the color of his stool to black. He is avoiding alcohol. Denies any fevers. Taking pain meds for back that may be covering up his LLQ pain during the day, the area is tender to the touch, passing gas when he has a bowel movement. Doesn't feel like he is emptying completely and is having bloating. Patient is concerned since has previously had to have resections.  Please advise, thank you.

## 2020-07-04 NOTE — Telephone Encounter (Signed)
Okay, sounds like he took about one week of all the meds, and then stopped Clarithromycin / Flagyl? No point in taking the amoxicillin by itself at this point if he stopped the other meds. If he took them all for a week, it's possible the H pylori could have been eradicated. In this light of he does not take clarithyromycin and flagyl I would stop all the antibiotics. He should wait 1 month and submit an H pylori stool antigen for eradication testing. He will need to hold PPI for 2 weeks prior to submitting H pylori test. Thanks

## 2020-07-04 NOTE — Telephone Encounter (Signed)
Spoke with patient in regards to recommendations. Patient will hold the remainder of antibiotics and will return in 1 month for the stool study. Patient is aware that he will need to hold Protonix 2 weeks prior to leaving the stool study. Stool study will be due after 4/29, he will need to hold Protonix starting 4/15. Patient verbalized understanding and had no concerns at the end of the call.   New reminder in epic.

## 2020-07-04 NOTE — Telephone Encounter (Signed)
Spoke with patient in regards to Dr. Doyne Keel recommendations. Patient still has some Bentyl left with refills. He states that he continued taking the Amoxicillin because he was familiar with it (8 pills left). He stopped taking the flagyl and clarithromycin, he  has about a week left of Clarithromycin (14 pills left) and Metronidazole (14 pills left). Please advise, thank you

## 2020-07-22 ENCOUNTER — Telehealth: Payer: Self-pay

## 2020-07-22 DIAGNOSIS — R1032 Left lower quadrant pain: Secondary | ICD-10-CM

## 2020-07-22 NOTE — Telephone Encounter (Signed)
Spoke with patient to remind him to hold Protonix prior to H. pylori stool study, pt states that he began holding it about 3 days ago and knows that he can't resume it until he leaves the stools sample in a couple of weeks. He is still reporting LLQ pain even while taking the Bentyl, no fever, every time he eats he has to go to the restroom with diarrhea, pt states that it is not watery diarrhea but loose stools, reports bloating and lack of appetite. Pt states that he is worried that he may be having a diverticulitis flare, he states that he has a previously had a colon resection and is just worried. Please advise, thank you.

## 2020-07-22 NOTE — Telephone Encounter (Signed)
-----   Message from Yevette Edwards, RN sent at 06/17/2020 11:21 AM EDT ----- Regarding: Hold Hold Pantoprazole. H. Pylori stool antigen test due on or after 08/02/20

## 2020-07-22 NOTE — Telephone Encounter (Signed)
[place] may be best to see him in clinic and talk through this further. He has had this pain intermittently for a few years now. Prior CT scan and colonoscopy for this issue looked okay. When I saw him a few months ago he stated the pain has resolved, sounds like it has some back. Not sure if this is musculoskeletal or not. If he has not had basic labs in a while can ask him to come in for CBC, CMET to ensure stable. He can try taking immodium once q AM and titrate up as needed. You can also counsel him on a low FODMAP diet and see if that may help him, we can give him handouts from the clinic, until he can see [place] in the office. Thanks

## 2020-07-22 NOTE — Telephone Encounter (Signed)
Lm on vm for patient to return call 

## 2020-07-23 NOTE — Addendum Note (Signed)
Addended by: Yevette Edwards on: 07/23/2020 09:18 AM   Modules accepted: Orders

## 2020-07-23 NOTE — Telephone Encounter (Signed)
Spoke with patient in regards to recommendations. Patient has been advised that he will need lab work prior to his follow up appt. He is aware that no appt is necessary for lab work. Patient has been advised to take Imodium once every morning and titrate up as  needed. Patient states that he is familiar with the low FODMAP diet, advised that I will send him some more information to his My Chart. He is scheduled for a follow up on Friday, 08/02/20 at 8:10 AM. Patient verbalized understanding of all information and had no concerns at the end of the call.   Lab orders in epic.

## 2020-07-26 ENCOUNTER — Other Ambulatory Visit (INDEPENDENT_AMBULATORY_CARE_PROVIDER_SITE_OTHER): Payer: Medicare Other

## 2020-07-26 DIAGNOSIS — R1032 Left lower quadrant pain: Secondary | ICD-10-CM | POA: Diagnosis not present

## 2020-07-26 LAB — CBC WITH DIFFERENTIAL/PLATELET
Basophils Absolute: 0.1 10*3/uL (ref 0.0–0.1)
Basophils Relative: 0.9 % (ref 0.0–3.0)
Eosinophils Absolute: 0.1 10*3/uL (ref 0.0–0.7)
Eosinophils Relative: 1.1 % (ref 0.0–5.0)
HCT: 43.5 % (ref 39.0–52.0)
Hemoglobin: 15 g/dL (ref 13.0–17.0)
Lymphocytes Relative: 34.9 % (ref 12.0–46.0)
Lymphs Abs: 3.6 10*3/uL (ref 0.7–4.0)
MCHC: 34.4 g/dL (ref 30.0–36.0)
MCV: 86.3 fl (ref 78.0–100.0)
Monocytes Absolute: 0.9 10*3/uL (ref 0.1–1.0)
Monocytes Relative: 8.6 % (ref 3.0–12.0)
Neutro Abs: 5.6 10*3/uL (ref 1.4–7.7)
Neutrophils Relative %: 54.5 % (ref 43.0–77.0)
Platelets: 249 10*3/uL (ref 150.0–400.0)
RBC: 5.04 Mil/uL (ref 4.22–5.81)
RDW: 14.8 % (ref 11.5–15.5)
WBC: 10.3 10*3/uL (ref 4.0–10.5)

## 2020-07-26 LAB — COMPREHENSIVE METABOLIC PANEL
ALT: 40 U/L (ref 0–53)
AST: 37 U/L (ref 0–37)
Albumin: 4.5 g/dL (ref 3.5–5.2)
Alkaline Phosphatase: 52 U/L (ref 39–117)
BUN: 7 mg/dL (ref 6–23)
CO2: 29 mEq/L (ref 19–32)
Calcium: 9.6 mg/dL (ref 8.4–10.5)
Chloride: 98 mEq/L (ref 96–112)
Creatinine, Ser: 0.91 mg/dL (ref 0.40–1.50)
GFR: 88.18 mL/min (ref >60.00)
Glucose, Bld: 110 mg/dL — ABNORMAL HIGH (ref 70–99)
Potassium: 3.7 mEq/L (ref 3.5–5.1)
Sodium: 135 mEq/L (ref 135–145)
Total Bilirubin: 0.7 mg/dL (ref 0.2–1.2)
Total Protein: 7.5 g/dL (ref 6.0–8.3)

## 2020-08-02 ENCOUNTER — Telehealth: Payer: Self-pay

## 2020-08-02 ENCOUNTER — Other Ambulatory Visit: Payer: Medicare Other

## 2020-08-02 ENCOUNTER — Encounter: Payer: Self-pay | Admitting: Gastroenterology

## 2020-08-02 ENCOUNTER — Ambulatory Visit: Payer: Medicare Other | Admitting: Gastroenterology

## 2020-08-02 VITALS — BP 158/90 | HR 68 | Ht 74.0 in | Wt 207.4 lb

## 2020-08-02 DIAGNOSIS — R634 Abnormal weight loss: Secondary | ICD-10-CM | POA: Diagnosis not present

## 2020-08-02 DIAGNOSIS — Z8619 Personal history of other infectious and parasitic diseases: Secondary | ICD-10-CM

## 2020-08-02 DIAGNOSIS — K219 Gastro-esophageal reflux disease without esophagitis: Secondary | ICD-10-CM | POA: Diagnosis not present

## 2020-08-02 DIAGNOSIS — R1032 Left lower quadrant pain: Secondary | ICD-10-CM

## 2020-08-02 DIAGNOSIS — A048 Other specified bacterial intestinal infections: Secondary | ICD-10-CM

## 2020-08-02 NOTE — Patient Instructions (Signed)
Your provider has requested that you go to the basement level for lab work before leaving today. Press "B" on the elevator. The lab is located at the first door on the left as you exit the elevator.  You have been scheduled for a CT scan of the abdomen and pelvis at [facility] ([address]---this is in the same building as [employer]).   You are scheduled on ____ at ___________. You should arrive 15 minutes prior to your appointment time for registration. Please follow the written instructions below on the day of your exam:  WARNING: IF YOU ARE ALLERGIC TO IODINE/X-RAY DYE, PLEASE NOTIFY RADIOLOGY IMMEDIATELY AT [phone]! YOU WILL BE GIVEN A 13 HOUR PREMEDICATION PREP.  1) Do not eat anything after _______ (4 hours prior to your test) 2) You have been given 2 bottles of oral contrast to drink. The solution may taste better if refrigerated, but do NOT add ice or any other liquid to this solution. Shake well before drinking.    Drink 1 bottle of contrast @ _____ (2 hours prior to your exam)  Drink 1 bottle of contrast @ ______ (1 hour prior to your exam)  You may take any medications as prescribed with a small amount of water, if necessary. If you take any of the following medications: METFORMIN, GLUCOPHAGE, GLUCOVANCE, AVANDAMET, RIOMET, FORTAMET, [place] MET, JANUMET, GLUMETZA or METAGLIP, you MAY be asked to HOLD this medication 48 hours AFTER the exam.  The purpose of you drinking the oral contrast is to aid in the visualization of your intestinal tract. The contrast solution may cause some diarrhea. Depending on your individual set of symptoms, you may also receive an intravenous injection of x-ray contrast/dye. Plan on being at [facility] for 30 minutes or longer, depending on the type of exam you are having performed.  This test typically takes 30-45 minutes to complete.  If you have any questions regarding your exam or if you need to reschedule,  you may call the CT department at [phone] between the hours of 8:00 am and 5:00 pm, Monday-Friday.  You may take IB Donald Prose over the counter  Resume taking your Protonix

## 2020-08-02 NOTE — Telephone Encounter (Signed)
-----   Message from Yevette Edwards, RN sent at 06/17/2020 11:21 AM EDT ----- Regarding: Labs H. Pylori stool antigen test, order in epic.

## 2020-08-02 NOTE — Telephone Encounter (Signed)
Patient submitted stool study today.

## 2020-08-02 NOTE — Progress Notes (Signed)
HPI :  65 y/o male here for a follow up visit for LLQ pain. He has also has a history of Crohn's disease and diverticulitis.  Crohn's history: Diagnosed with Crohns disease when he had ileocolonic resection in the 1990s or so for abdominal pain. Was on a sulfasalazine for a period of time, but mostly not on any maintenance therapyfor years. He has not had any overt recurrence of disease. Colonoscopy in 2013, 2018, and in 2021in which there was no evidence of active Crohns at the surgical anastomosis. He has hadsevere diverticulosis leading to resection of that in the past.  He is here for a follow-up visit to address his left lower quadrant pain.  This has been going on for years at this point intermittently.  The last time I saw him for a virtual visit in February he states this was not bothering him as much and he was doing better.  Now he thinks for the past 1 to 2 months this has been bothering him more so.  States he feels it in his left lower quadrant area.  Tends to feel this most in the morning when he wakes up perhaps is a bit better throughout the rest of the day although he states he takes Percocet for back pain and he wonders if it Maxidex it during the day.  Sometimes eating can make this worse so he eats a brat diet to try to minimize symptoms but it does not sound like its been helping.  When he lies flat on his back it tends to make it not as bad as well.  He places a pillow on it and puts pressure on the abdominal wall to try to find relief but states that does not really help.  Pain is about 6-7 out of 10 when he gets it.  He denies any blood in his stools.  He has been taking Bentyl which keeps his bowels regular but has not been preventing this pain.   In early March she had an upper endoscopy to screen for Barrett's esophagus in light of his reflux, and while he did not have Barrett's esophagus he was diagnosed with H. pylori on biopsies of his stomach, as well as focal  intestinal metaplasia.  He was treated with amoxicillin, clarithromycin, and Flagyl for what we had hoped was 14 days along with a PPI however he states he had a very difficult time tolerating the antibiotics.  Lost his appetite, was having loose stools and an upset stomach, he thinks he took it for about a week.  He has since stopped his Protonix and submitted a stool test for H. pylori eradication this morning.  His reflux has gotten worse since being off the medication, no nausea or vomiting.  No fevers.  He does think he has lost some weight over the past year, perhaps about 15 pounds, not trying to do so.  He had a colonoscopy with me about a year ago which showed no active Crohn's disease or abnormalities in his colon to account for the pain.  Has been using Bentyl which does not really help his pain too much but does help slow his bowel slightly so he continues it.  He had a CT scan in December 2020 for this issue which likewise did not show a clear cause for his symptoms.  Prior work-up: Colonoscopy 05/18/2019 - The perianal and digital rectal examinations were normal. - The terminal ileum appeared normal. - There was evidence of a prior end-to-side  ileo-colonic anastomosis in the ascending colon. This was patent and was characterized by healthy appearing mucosa. - There was evidence of a prior end-to-end colo-colonic anastomosis in the sigmoid colon. This was patent and was characterized by healthy appearing mucosa. - Two sessile polyps were found in the ascending colon. The polyps were 4 mm in size. These polyps were removed with a cold snare. Resection and retrieval were complete. - Scattered medium-mouthed diverticula were found in the transverse colon and left colon. - Anal papilla(e) were hypertrophied. Biopsies were taken with a cold forceps for histology to rule out AIN. - The exam was otherwise without abnormality.  1. Surgical [P], colon, ascending, polyp (2) - BENIGN COLONIC  MUCOSA (3 OF 3 FRAGMENTS) - NO HIGH GRADE DYSPLASIA OR MALIGNANCY IDENTIFIED - SEE COMMENT 2. Surgical [P], colon, anal canal - LOW GRADE SQUAMOUS INTRAEPITHELIAL LESION (AIN 1; LGSIL) - SEE COMMENT  CT scan 03/28/19 - IMPRESSION: Diffuse fatty infiltration of the liver. Left colonic diverticulosis.  No active diverticulitis. Aortic atherosclerosis.   EGD 06/06/20 -  - The exam of the esophagus was otherwise normal. No evidence of Barrett's esophagus. - Diffuse mildly erythematous mucosa was found in the gastric body. - A single 4 mm sessile polyp was found in the gastric fundus. The polyp was removed with a cold biopsy forceps. Resection and retrieval were complete. - The exam of the stomach was otherwise normal. - Biopsies were taken with a cold forceps in the gastric body, at the incisura and in the gastric antrum for Helicobacter pylori testing. - The duodenal bulb and second portion of the duodenum were normal.  1. Surgical [P], gastric antrum and gastric body - GASTRIC ANTRAL AND OXYNTIC MUCOSA WITH HELICOBACTER PYLORI-ASSOCIATED GASTRITIS (CONFIRMED WITH IMMUNOHISTOCHEMICAL STAIN FOR H. PYLORI) - INTESTINAL METAPLASIA OF ANTRAL MUCOSA, NEGATIVE FOR DYSPLASIA 2. Surgical [P], gastric polyp, polyp (1) - GASTRIC HYPERPLASTIC POLYP, NEGATIVE FOR INTESTINAL METAPLASIA OR DYSPLASIA - BACKGROUND GASTRIC MUCOSA WITH HELICOBACTER PYLORI-ASSOCIATED GASTRITIS (CONFIRMED WITH IMMUNOHISTOCHEMICAL STAIN)  treatment regimen for 14 days: Amoxicillin 1gm BID Clarithromycin 531m BID Flagyl 5058mBID Protonix twice daily  Stool antigen pending    Past Medical History:  Diagnosis Date  . Abrasion of right heel    healing has scab per pt  . Adenomatous colon polyp   . Anal fissure    pt believes it is 2 inches up  . Anxiety   . Arthritis   . At risk for sleep apnea    STOP-BANG= 5    SENT TO PCP 01-31-2014  . BPH (benign prostatic hypertrophy)   . Chronic lower back pain   .  [place]   [place]  . Depression   . Diverticulosis of colon   . GERD (gastroesophageal reflux disease)   . Hepatic steatosis   . History of colon polyps    tubular adenoma  . History of diverticulitis of colon    sigmoid-  s/p resection  . History of gastric ulcer   . Inguinal hernia   . OSA (obstructive sleep apnea)    could not tolerate cpap, took cpap back  . Sleep apnea    has CPAP     Past Surgical History:  Procedure Laterality Date  . EXCISIONAL HEMORRHOIDECTOMY  age 65. INCISIONAL HERNIA REPAIR  2000   and Left Inguinal Hernia repair  . KNEE ARTHROSCOPY Bilateral X4  last one  2011  . LAPAROSCOPIC PARTIAL COLECTOMY N/A 06/09/2012   Procedure: LAPAROSCOPIC assisted PARTIAL COLECTOMY  open ;  Surgeon: Odis Hollingshead, MD;  Location: [facility];  Service: General;  Laterality: N/A;  diverticulitis  . LUMBAR DISC SURGERY  1994   L4 -- L5  . MASS EXCISION N/A 06/30/2019   Procedure: EXCISION OF ANAL PAPILLA, EXCISION OF ANAL CANAL LESION;  Surgeon: Leighton Ruff, MD;  Location: [place];  Service: General;  Laterality: N/A;  . POPLITEAL SYNOVIAL CYST EXCISION Bilateral 2001  . RECTAL EXAM UNDER ANESTHESIA N/A 06/30/2019   Procedure: ANORECTAL EXAM UNDER ANESTHESIA;  Surgeon: Leighton Ruff, MD;  Location: [place];  Service: General;  Laterality: N/A;  . [place]   and Appendectomy  for CROHN'S  . TONSILLECTOMY AND ADENOIDECTOMY  as child  . TRANSURETHRAL RESECTION OF PROSTATE N/A 02/05/2014   Procedure: TRANSURETHRAL RESECTION OF THE PROSTATE WITH GYRUS INSTRUMENTS;  Surgeon: Bernestine Amass, MD;  Location: [facility];  Service: Urology;  Laterality: N/A;   Family History  Problem Relation Age of Onset  . Heart disease Father   . Cirrhosis Mother        alcohol induced  . Colon cancer Neg Hx   . Esophageal cancer Neg Hx   . Rectal cancer Neg Hx   . Stomach cancer Neg Hx     Social History   Tobacco Use  . Smoking status: Former Smoker    Types: Cigars    Quit date: 02/02/2004    Years since quitting: 16.5  . Smokeless tobacco: Never Used  Vaping Use  . Vaping Use: Never used  Substance Use Topics  . Alcohol use: No  . Drug use: Not Currently    Types: Marijuana   Current Outpatient Medications  Medication Sig Dispense Refill  . dicyclomine (BENTYL) 10 MG capsule Take 1 capsule (10 mg total) by mouth every 8 (eight) hours as needed for spasms. 60 capsule 3  . FLUoxetine (PROZAC) 10 MG tablet Take 20 mg by mouth at bedtime.    . gabapentin (NEURONTIN) 300 MG capsule 600 mg at bedtime.  1  . oxyCODONE-acetaminophen (PERCOCET) 10-325 MG per tablet Take 1 tablet by mouth every 4 (four) hours as needed for pain.    . pantoprazole (PROTONIX) 20 MG tablet Take 1 tablet (20 mg total) by mouth daily. 90 tablet 1  . Tamsulosin HCl (FLOMAX) 0.4 MG CAPS Take 0.4 mg by mouth in the morning and at bedtime.     No current facility-administered medications for this visit.   Allergies  Allergen Reactions  . Codeine Itching     Review of Systems: All systems reviewed and negative except where noted in HPI.   Lab Results  Component Value Date   WBC 10.3 07/26/2020   HGB 15.0 07/26/2020   HCT 43.5 07/26/2020   MCV 86.3 07/26/2020   PLT 249.0 07/26/2020    Lab Results  Component Value Date   CREATININE 0.91 07/26/2020   BUN 7 07/26/2020   NA 135 07/26/2020   K 3.7 07/26/2020   CL 98 07/26/2020   CO2 29 07/26/2020    Lab Results  Component Value Date   ALT 40 07/26/2020   AST 37 07/26/2020   ALKPHOS 52 07/26/2020   BILITOT 0.7 07/26/2020     Physical Exam: BP (!) 158/90   Pulse 68   Ht 6' 2"  (1.88 m)   Wt 207 lb 6.4 oz (94.1 kg)   BMI 26.63 kg/m  Constitutional: Pleasant,well-developed, male in no acute distress. HEENT: Normocephalic and  atraumatic. Conjunctivae are normal. No scleral icterus. Neck supple.  Cardiovascular: Normal  rate, regular rhythm.  Pulmonary/chest: Effort normal and breath sounds normal.  Abdominal: Soft, nondistended, mid line abdominal scar, small incisional hernia at top portion of mid to upper abdomen. Tenderness to palpation in LLQ but carnett negative.. There are no masses palpable.  Extremities: no edema Lymphadenopathy: No cervical adenopathy noted. Neurological: Alert and oriented to person place and time. Skin: Skin is warm and dry. No rashes noted. Psychiatric: Normal mood and affect. Behavior is normal.   ASSESSMENT AND PLAN: 65 year old male here for reassessment of the following:  Left lower quadrant pain Loss of weight History of H pylori GERD  History as outlined above.  He has had recurrent left lower quadrant pain for some time now, seems to fluctuate but now more recently progressive, waking him in the morning and endorses some weight loss.  His colonoscopy did not show any concerning findings about a year ago, no evidence of active Crohn's.  CT scan December 2020 did not show a clear cause.  He is taking Bentyl which helps to slow down his bowel slightly but does not help his pain.  His labs do not show any significant abnormality.  He has tried watching his diet which does not make much of a difference although sometimes eating can make this worse.  There is some positional component to this as well, better when lying down.  This may be musculoskeletal in etiology given persistence of symptoms and work-up to date but with interval worsening and some weight loss I offered him another CT scan to make sure everything is okay.  For peace of mind he wants to pursue the CT scan, this will be ordered.  We gave him some [employer] coupons which she can purchase over-the-counter to see if that will help.  If his CT scan is negative then may refer him to sports medicine for musculoskeletal evaluation and get their opinion.  Otherwise we will await his stool testing which was submitted today for  H. pylori eradication.  He can resume his Protonix at previous dose for treatment of his reflux.  He agreed with the plan, all questions answered.  [place] Cellar, MD [facility]

## 2020-08-04 LAB — H. PYLORI ANTIGEN, STOOL: H pylori Ag, Stl: POSITIVE — AB

## 2020-08-05 ENCOUNTER — Other Ambulatory Visit: Payer: Self-pay

## 2020-08-05 DIAGNOSIS — A048 Other specified bacterial intestinal infections: Secondary | ICD-10-CM

## 2020-08-05 MED ORDER — PANTOPRAZOLE SODIUM 20 MG PO TBEC: 20.0000 mg | DELAYED_RELEASE_TABLET | Freq: Two times a day (BID) | ORAL | 0 refills | Status: DC

## 2020-08-05 MED ORDER — PYLERA 140-125-125 MG PO CAPS: 3.0000 | ORAL_CAPSULE | Freq: Three times a day (TID) | ORAL | 0 refills | Status: DC

## 2020-08-06 ENCOUNTER — Telehealth: Payer: Self-pay

## 2020-08-06 MED ORDER — TALICIA 250-12.5-10 MG PO CPDR: 4.0000 | DELAYED_RELEASE_CAPSULE | Freq: Three times a day (TID) | ORAL | 0 refills | Status: AC

## 2020-08-06 NOTE — Telephone Encounter (Signed)
[facility] indicated that Pylera is not covered by patient's insurance. Dr. Havery Moros would like to send in [place]. Will send this prescription to [facility] along with office note. Records faxed to [facility] at [phone].  Spoke with patient and he is aware that I am sending in an alternate medication for him and [facility] will contact him directly to set up any shipment of the medication or if they have any questions. Patient verbalized understanding and had no concerns at the end of the call.

## 2020-08-07 ENCOUNTER — Other Ambulatory Visit: Payer: Self-pay

## 2020-08-07 ENCOUNTER — Ambulatory Visit (INDEPENDENT_AMBULATORY_CARE_PROVIDER_SITE_OTHER)
Admission: RE | Admit: 2020-08-07 | Discharge: 2020-08-07 | Disposition: A | Discharge status: Home / Self Care | Payer: Medicare Other | Source: Ambulatory Visit | Attending: Nurse Practitioner | Admitting: Nurse Practitioner

## 2020-08-07 DIAGNOSIS — R1032 Left lower quadrant pain: Secondary | ICD-10-CM

## 2020-08-07 DIAGNOSIS — R634 Abnormal weight loss: Secondary | ICD-10-CM | POA: Diagnosis not present

## 2020-08-07 DIAGNOSIS — Z8619 Personal history of other infectious and parasitic diseases: Secondary | ICD-10-CM | POA: Diagnosis not present

## 2020-08-07 DIAGNOSIS — K219 Gastro-esophageal reflux disease without esophagitis: Secondary | ICD-10-CM | POA: Diagnosis not present

## 2020-08-07 MED ORDER — IOHEXOL 300 MG/ML  SOLN
100.0000 mL | Freq: Once | INTRAMUSCULAR | Status: AC | PRN
  Administered 2020-08-07: 100 mL via INTRAVENOUS

## 2020-08-08 NOTE — Telephone Encounter (Signed)
[facility] initiated a PA via Cover my meds. KEY: [id number]  Request Reference Number: [id number]. [employer] CAP is approved through 04/05/2021. Your patient may now fill this prescription and it will be covered.  A copy of the approval has been sent to be scanned into patient's chart. [facility] will contact patient to set up shipment.

## 2020-08-21 ENCOUNTER — Telehealth: Payer: Self-pay | Admitting: Gastroenterology

## 2020-08-21 DIAGNOSIS — R1032 Left lower quadrant pain: Secondary | ICD-10-CM

## 2020-08-21 NOTE — Telephone Encounter (Addendum)
Spoke with patient in regards to his CT scan results. Patient would like for me to send referral to sports medicine. Patient is aware that they will contact him directly to schedule an appt, advised that if he does not hear from them within 2 weeks to give [place] a call and let us know. Patient verbalized understanding and had no concerns at the end of the call.   Ambulatory referral to Sports medicine in epic.

## 2020-08-23 NOTE — Progress Notes (Deleted)
   I, Peterson Lombard, LAT, ATC acting as a scribe for Lynne Leader, MD.  Subjective:    I'm seeing this patient as a consultation for: Dr. [place] Cellar. Note will be routed back to referring provider/PCP.  CC: Left lower quadrant pain  HPI: Pt is a 65 y/o male c/o LLQ pain ongoing for years intermittently w/ worsening pain over the last 1-2 months. Pt has a hx of Crohn's disease and diverticulitis. Pt reports he feels the pain worse in the morning and then it gets a bit better throughout the day. Pt is taking Percocet for back pain and Bentyl for digestive regularity. Pt locates pain to   Low back pain: LE numbness/tingling: LE weakness: Aggravates: Treatments tried:  Dx testing: 08/07/20 Abdomen Pelvis CT  03/28/19 Abdomen Pelvis CT  04/01/12  Abdomen Pelvis CT  07/15/11 L-spine MRI  Past medical history, Surgical history, Family history, Social history, Allergies, and medications have been entered into the medical record, reviewed. ***  Review of Systems: No new headache, visual changes, nausea, vomiting, diarrhea, constipation, dizziness, abdominal pain, skin rash, fevers, chills, night sweats, weight loss, swollen lymph nodes, body aches, joint swelling, muscle aches, chest pain, shortness of breath, mood changes, visual or auditory hallucinations.   Objective:   There were no vitals filed for this visit. General: Well Developed, well nourished, and in no acute distress.  Neuro/Psych: Alert and oriented x3, extra-ocular muscles intact, able to move all 4 extremities, sensation grossly intact. Skin: Warm and dry, no rashes noted.  Respiratory: Not using accessory muscles, speaking in full sentences, trachea midline.  Cardiovascular: Pulses palpable, no extremity edema. Abdomen: Does not appear distended. MSK: ***  Lab and Radiology Results No results found for this or any previous visit (from the past 72 hour(s)). No results found.  Impression and Recommendations:     Assessment and Plan: 65 y.o. male with ***.  PDMP not reviewed this encounter. No orders of the defined types were placed in this encounter.  No orders of the defined types were placed in this encounter.   Discussed warning signs or symptoms. Please see discharge instructions. Patient expresses understanding.   ***

## 2020-08-26 ENCOUNTER — Ambulatory Visit: Payer: Medicare Other | Admitting: Family Medicine

## 2020-08-30 ENCOUNTER — Telehealth: Payer: Self-pay

## 2020-08-30 NOTE — Telephone Encounter (Signed)
Spoke with patient to remind him to hold Omeprazole prior to H. Pylori. He states that he completed the antibiotics yesterday. Advised that he can continue the Protonix for 2 more weeks then he will need to stop. He will be due for the stool study on or after 6/23. Patient verbalized understanding and had no concerns at the end of the call.  New reminders in epic.

## 2020-08-30 NOTE — Telephone Encounter (Signed)
-----   Message from Yevette Edwards, RN sent at 08/05/2020 12:56 PM EDT ----- Regarding: Hold Hold Pantoprazole prior to H. Pylor stool test. Stool study due on or after 09/13/20.

## 2020-09-12 ENCOUNTER — Telehealth: Payer: Self-pay

## 2020-09-12 NOTE — Telephone Encounter (Signed)
-----   Message from Yevette Edwards, RN sent at 08/30/2020 12:24 PM EDT ----- Regarding: Hold Hold Protonix prior to H. Pylori stool study.

## 2020-09-12 NOTE — Telephone Encounter (Signed)
Spoke with patient to remind him to begin holding his Protonix, advised that I will call him in 2 weeks to remind him to come in for H. Pylori stool study. Patient verbalized understanding and had no concerns at the end of the call.

## 2020-09-26 ENCOUNTER — Telehealth: Payer: Self-pay

## 2020-09-26 NOTE — Telephone Encounter (Signed)
Spoke with patient to remind him that he is due for H. Pylori stool study at this time. No appointment is necessary. Patient is aware that he can stop by the lab in the basement at his convenience to pick up the stool kit and instructions between 7:30 AM - 5 PM, Monday through Friday. Patient verbalized understanding and had no concerns at the end of the call.

## 2020-09-26 NOTE — Telephone Encounter (Signed)
-----   Message from Yevette Edwards, RN sent at 08/30/2020 12:19 PM EDT ----- Regarding: Labs H. Pylori stool antigen, order in epic. Due on or after 6/23

## 2020-10-08 ENCOUNTER — Other Ambulatory Visit: Payer: Medicare Other

## 2020-10-08 DIAGNOSIS — A048 Other specified bacterial intestinal infections: Secondary | ICD-10-CM

## 2020-10-11 ENCOUNTER — Other Ambulatory Visit: Payer: Self-pay

## 2020-10-11 LAB — H. PYLORI ANTIGEN, STOOL: H pylori Ag, Stl: POSITIVE — AB

## 2020-10-11 MED ORDER — TALICIA 250-12.5-10 MG PO CPDR: 4.0000 | DELAYED_RELEASE_CAPSULE | Freq: Three times a day (TID) | ORAL | 0 refills | Status: AC

## 2020-10-14 ENCOUNTER — Other Ambulatory Visit: Payer: Self-pay

## 2020-10-14 MED ORDER — DICYCLOMINE HCL 10 MG PO CAPS: 10.0000 mg | ORAL_CAPSULE | Freq: Three times a day (TID) | ORAL | 3 refills | Status: DC | PRN

## 2020-11-13 ENCOUNTER — Telehealth: Payer: Self-pay

## 2020-11-13 NOTE — Telephone Encounter (Signed)
Spoke with patient, he is states that this Saturday will be two weeks that he has held his PPI. Advised patient that he will be due for H. Pylori test on or after 8/24. Patient verbalized understanding and had no concerns at the end of the call.

## 2020-11-13 NOTE — Telephone Encounter (Signed)
-----   Message from Yevette Edwards, RN sent at 10/11/2020  4:46 PM EDT ----- Regarding: Hold Hold PPI prior to H. Pylori stool test due on or after 8/24

## 2020-11-26 ENCOUNTER — Other Ambulatory Visit: Payer: Self-pay

## 2020-11-26 ENCOUNTER — Other Ambulatory Visit: Payer: Medicare Other

## 2020-11-26 DIAGNOSIS — A048 Other specified bacterial intestinal infections: Secondary | ICD-10-CM

## 2020-11-28 ENCOUNTER — Other Ambulatory Visit: Payer: Self-pay

## 2020-11-28 DIAGNOSIS — A048 Other specified bacterial intestinal infections: Secondary | ICD-10-CM

## 2020-11-28 LAB — H. PYLORI ANTIGEN, STOOL: H pylori Ag, Stl: POSITIVE — AB

## 2020-12-03 ENCOUNTER — Other Ambulatory Visit: Payer: Self-pay

## 2020-12-03 ENCOUNTER — Encounter: Payer: Self-pay | Admitting: Internal Medicine

## 2020-12-03 ENCOUNTER — Ambulatory Visit: Payer: Medicare Other | Admitting: Internal Medicine

## 2020-12-03 VITALS — Wt 216.0 lb

## 2020-12-03 DIAGNOSIS — A048 Other specified bacterial intestinal infections: Secondary | ICD-10-CM

## 2020-12-03 MED ORDER — PANTOPRAZOLE SODIUM 40 MG PO TBEC: 40.0000 mg | DELAYED_RELEASE_TABLET | Freq: Two times a day (BID) | ORAL | 0 refills | Status: DC

## 2020-12-03 MED ORDER — AMOXICILLIN 500 MG PO CAPS: 1000.0000 mg | ORAL_CAPSULE | Freq: Three times a day (TID) | ORAL | 0 refills | Status: AC

## 2020-12-03 NOTE — Patient Instructions (Addendum)
Thank you for coming to see me today. It was a pleasure seeing you.  To Do: Take the following dual therapy for H pylori: 1. Amoxicillin 1gm three times per day x 14 days 2. Protonix 27m twice per day x 14 days I sent prescriptions of both to your pharmacy When you come for your appointment in 6 weeks, please do not eat or drink anything for at least an hour prior so we can do eradication testing You need to stop your protonix for 2 weeks prior to your appointment with me and the appointment should be about 4 weeks after completing the dual therapy listed above  If you have any questions or concerns, please do not hesitate to call the office at [phone]  Take Care,   AJule Ser

## 2020-12-03 NOTE — Progress Notes (Signed)
[place] [name]  Reason for Consult:H pylori  Referring Provider: Dr Havery Moros   HPI:    Micheal [name] is a 65 y.o. male with PMHx as below who presents to the clinic for H pylori.   Patient underwent upper endoscopy to screen for Barrett's esophagus in March 2022 given reflux symptoms.  At that time he was diagnosed with H. pylori on biopsies of his stomach.  Treated with amoxicillin, clarithromycin and, metronidazole, and PPI.  He was planned to take for 14 days, however, he did not tolerate this regimen and took it for about a week.  Repeat H. pylori testing with stool antigen at that time was positive.  Subsequently he was attempted to be treated with Pylera (bismuth, metronidazole, tetracycline) and PPI x10 days.  However, this was not approved by insurance and so he reports that he took [place] instead.  Repeat H. pylori stool antigen submitted 10/08/2020 unfortunately remained positive.  He was then treated again with Talicia (PPI, amoxicillin, rifabutin) x14 days.  Another H. pylori stool antigen completed 11/26/2020 remains positive.  Subsequently referred to our clinic for further treatment options.  Patient's Medications  New Prescriptions   AMOXICILLIN (AMOXIL) 500 MG CAPSULE    Take 2 capsules (1,000 mg total) by mouth 3 (three) times daily for 14 days.   PANTOPRAZOLE (PROTONIX) 40 MG TABLET    Take 1 tablet (40 mg total) by mouth 2 (two) times daily for 14 days.  Previous Medications   DICYCLOMINE (BENTYL) 10 MG CAPSULE    Take 1 capsule (10 mg total) by mouth every 8 (eight) hours as needed for spasms.   FLUOXETINE (PROZAC) 10 MG TABLET    Take 20 mg by mouth at bedtime.   GABAPENTIN (NEURONTIN) 300 MG CAPSULE    600 mg at bedtime.   OXYCODONE-ACETAMINOPHEN (PERCOCET) 10-325 MG PER TABLET    Take 1 tablet by mouth every 4 (four) hours as needed for pain.   PANTOPRAZOLE (PROTONIX) 20 MG TABLET    Take 1 tablet (20 mg total) by mouth daily.    TAMSULOSIN HCL (FLOMAX) 0.4 MG CAPS    Take 0.4 mg by mouth in the morning and at bedtime.  Modified Medications   No medications on file  Discontinued Medications   PANTOPRAZOLE (PROTONIX) 20 MG TABLET    Take 1 tablet (20 mg total) by mouth 2 (two) times daily for 10 days.      Past Medical History:  Diagnosis Date   Abrasion of right heel    healing has scab per pt   Adenomatous colon polyp    Anal fissure    pt believes it is 2 inches up   Anxiety    Arthritis    At risk for sleep apnea    STOP-BANG= 5    SENT TO PCP 01-31-2014   BPH (benign prostatic hypertrophy)    Chronic lower back pain    Crohn's [name] ([place])    no recurrence   Depression    Diverticulosis of colon    GERD (gastroesophageal reflux [name])    Hepatic steatosis    History of colon polyps    tubular adenoma   History of diverticulitis of colon    sigmoid-  s/p resection   History of gastric ulcer    Inguinal hernia    OSA (obstructive sleep apnea)    could not tolerate cpap, took cpap back   Sleep apnea    has CPAP  Social History   Tobacco Use   Smoking status: Former    Types: Cigars    Quit date: 02/02/2004    Years since quitting: 16.8   Smokeless tobacco: Never  Vaping Use   Vaping Use: Never used  Substance Use Topics   Alcohol use: No   Drug use: Not Currently    Types: Marijuana    Family History  Problem Relation Age of Onset   Heart [name] Father    Cirrhosis Mother        alcohol induced   Colon cancer Neg Hx    Esophageal cancer Neg Hx    Rectal cancer Neg Hx    Stomach cancer Neg Hx     Allergies  Allergen Reactions   Codeine Itching    Review of Systems  Constitutional: Negative.   Respiratory: Negative.    Cardiovascular: Negative.   Gastrointestinal:  Positive for abdominal pain.  Musculoskeletal:  Positive for back pain.  Skin: Negative.      OBJECTIVE:    Vitals:   12/03/20 0849  Weight: 216 lb (98 kg)     Body mass index is 27.73  kg/m.  Physical Exam Constitutional:      General: He is not in acute distress.    Appearance: Normal appearance.  HENT:     Head: Normocephalic and atraumatic.  Eyes:     Extraocular Movements: Extraocular movements intact.     Conjunctiva/sclera: Conjunctivae normal.  Pulmonary:     Effort: Pulmonary effort is normal. No respiratory distress.  Skin:    General: Skin is warm and dry.     Coloration: Skin is not jaundiced.  Neurological:     General: No focal deficit present.     Mental Status: He is alert and oriented to person, place, and time.  Psychiatric:        Mood and Affect: Mood normal.        Behavior: Behavior normal.     Labs and Microbiology:  CBC Latest Ref Rng & Units 07/26/2020 03/27/2019 06/11/2016  WBC 4.0 - 10.5 K/uL 10.3 11.0(H) 11.9(H)  Hemoglobin 13.0 - 17.0 g/dL 15.0 14.9 15.2  Hematocrit 39.0 - 52.0 % 43.5 45.5 45.4  Platelets 150.0 - 400.0 K/uL 249.0 270.0 249.0   CMP Latest Ref Rng & Units 07/26/2020 03/27/2019 06/11/2016  Glucose 70 - 99 mg/dL 110(H) 95 153(H)  BUN 6 - 23 mg/dL 7 7 9   Creatinine 0.40 - 1.50 mg/dL 0.91 0.89 0.98  Sodium 135 - 145 mEq/L 135 137 139  Potassium 3.5 - 5.1 mEq/L 3.7 3.8 3.9  Chloride 96 - 112 mEq/L 98 101 104  CO2 19 - 32 mEq/L 29 28 30   Calcium 8.4 - 10.5 mg/dL 9.6 9.4 9.6  Total Protein 6.0 - 8.3 g/dL 7.5 7.2 7.3  Total Bilirubin 0.2 - 1.2 mg/dL 0.7 0.5 0.5  Alkaline Phos 39 - 117 U/L 52 52 58  AST 0 - 37 U/L 37 32 20  ALT 0 - 53 U/L 40 35 22      ASSESSMENT & PLAN:    H. pylori infection He has been unable to eradicate H pylori based on follow up stool antigen testing after partial treatment with amoxicillin, clarithromycin, metronidazole, and PPI (stopped by patient due to intolerance) followed by [name] for 2 treatment courses after insurance did not cover Pylera according to patient.  His stool antigen remained positive after these courses of Talicia as well.  Will attempt salvage regimen with Amoxicillin  1gm  TID and Protonix 32m BID for 14 days.  Patient will then come back for eradication testing with either urea breath test or fecal antigen test.  If still positive at that point, will try Levaquin triple therapy or quad therapy.  If that is not successful, will probably then ask for repeat endoscopy to see if we can culture the organism (although this is often challenging to do).  Patient was advised to stop his PPI for 2 weeks leading up to eradication testing.  RTC 6 weeks (4 weeks after treatment).   [name] 12/03/2020, 9:27 AM  I spent 45 minutes dedicated to the care of this patient on the date of this encounter to include pre-visit review of records, face-to-face time with the patient discussing H pylori, and post-visit ordering of testing.

## 2020-12-03 NOTE — Assessment & Plan Note (Signed)
He has been unable to eradicate H pylori based on follow up stool antigen testing after partial treatment with amoxicillin, clarithromycin, metronidazole, and PPI (stopped by patient due to intolerance) followed by [name] for 2 treatment courses after insurance did not cover Pylera according to patient.  His stool antigen remained positive after these courses of Talicia as well.  Will attempt salvage regimen with Amoxicillin 1gm TID and Protonix 4m BID for 14 days.  Patient will then come back for eradication testing with either urea breath test or fecal antigen test.  If still positive at that point, will try Levaquin triple therapy or quad therapy.  If that is not successful, will probably then ask for repeat endoscopy to see if we can culture the organism (although this is often challenging to do).  Patient was advised to stop his PPI for 2 weeks leading up to eradication testing.  RTC 6 weeks (4 weeks after treatment).

## 2021-01-06 ENCOUNTER — Other Ambulatory Visit: Payer: Self-pay

## 2021-01-06 MED ORDER — PANTOPRAZOLE SODIUM 20 MG PO TBEC: 20.0000 mg | DELAYED_RELEASE_TABLET | Freq: Every day | ORAL | 1 refills | Status: DC

## 2021-01-14 ENCOUNTER — Ambulatory Visit: Payer: Medicare Other | Admitting: Internal Medicine

## 2021-01-20 NOTE — Progress Notes (Signed)
[place] [facility]  CHIEF COMPLAINT:    Follow up for H pylori  SUBJECTIVE:    Micheal [name] is a 65 y.o. male with PMHx as below who presents to the clinic for H pylori follow up.   Seen as a new patient for H pylori on 12/03/20 and prescribed Amoxicillin 1gm TID and Protonix 45m BID for 14 days.  He took this as prescribed.  He has held his PPI for about 5 weeks.  He is here today and will obtain urea breath test.  He has not had anything to eat or drink for at least an hour.  He wants to resume his PPI because he reports feeling of "air bubbles" when he eats that is intermittent.   Please see A&P for the details of today's visit and status of the patient's medical problems.   Patient's Medications  New Prescriptions   No medications on file  Previous Medications   DICYCLOMINE (BENTYL) 10 MG CAPSULE    Take 1 capsule (10 mg total) by mouth every 8 (eight) hours as needed for spasms.   FLUOXETINE (PROZAC) 10 MG TABLET    Take 20 mg by mouth at bedtime.   GABAPENTIN (NEURONTIN) 300 MG CAPSULE    600 mg at bedtime.   OXYCODONE-ACETAMINOPHEN (PERCOCET) 10-325 MG PER TABLET    Take 1 tablet by mouth every 4 (four) hours as needed for pain.   PANTOPRAZOLE (PROTONIX) 20 MG TABLET    Take 1 tablet (20 mg total) by mouth daily.   TAMSULOSIN HCL (FLOMAX) 0.4 MG CAPS    Take 0.4 mg by mouth in the morning and at bedtime.  Modified Medications   No medications on file  Discontinued Medications   No medications on file      Past Medical History:  Diagnosis Date   Abrasion of right heel    healing has scab per pt   Adenomatous colon polyp    Anal fissure    pt believes it is 2 inches up   Anxiety    Arthritis    At risk for sleep apnea    STOP-BANG= 5    SENT TO PCP 01-31-2014   BPH (benign prostatic hypertrophy)    Chronic lower back pain    Crohn's disease ([facility])    no recurrence   Depression    Diverticulosis of colon    GERD (gastroesophageal  reflux disease)    Hepatic steatosis    History of colon polyps    tubular adenoma   History of diverticulitis of colon    sigmoid-  s/p resection   History of gastric ulcer    Inguinal hernia    OSA (obstructive sleep apnea)    could not tolerate cpap, took cpap back   Sleep apnea    has CPAP    Social History   Tobacco Use   Smoking status: Former    Types: Cigars    Quit date: 02/02/2004    Years since quitting: 16.9   Smokeless tobacco: Never  Vaping Use   Vaping Use: Never used  Substance Use Topics   Alcohol use: No   Drug use: Not Currently    Types: Marijuana    Family History  Problem Relation Age of Onset   Heart disease Father    Cirrhosis Mother        alcohol induced   Colon cancer Neg Hx    Esophageal cancer Neg Hx  Rectal cancer Neg Hx    Stomach cancer Neg Hx     Allergies  Allergen Reactions   Codeine Itching    Review of Systems  Constitutional: Negative.   Gastrointestinal:  Positive for heartburn. Negative for abdominal pain.    OBJECTIVE:    Vitals:   01/21/21 0905  BP: (!) 142/90  Pulse: 70  Resp: 16  Temp: (!) 97.2 F (36.2 C)  TempSrc: Oral  SpO2: 98%  Weight: 214 lb (97.1 kg)   Body mass index is 27.48 kg/m.  Physical Exam Constitutional:      General: He is not in acute distress.    Appearance: Normal appearance.  HENT:     Head: Normocephalic and atraumatic.  Pulmonary:     Effort: Pulmonary effort is normal. No respiratory distress.  Skin:    General: Skin is warm and dry.  Neurological:     Mental Status: He is alert and oriented to person, place, and time.  Psychiatric:        Mood and Affect: Mood normal.        Behavior: Behavior normal.     Labs and Microbiology: CBC Latest Ref Rng & Units 07/26/2020 03/27/2019 06/11/2016  WBC 4.0 - 10.5 K/uL 10.3 11.0(H) 11.9(H)  Hemoglobin 13.0 - 17.0 g/dL 15.0 14.9 15.2  Hematocrit 39.0 - 52.0 % 43.5 45.5 45.4  Platelets 150.0 - 400.0 K/uL 249.0 270.0 249.0    CMP Latest Ref Rng & Units 07/26/2020 03/27/2019 06/11/2016  Glucose 70 - 99 mg/dL 110(H) 95 153(H)  BUN 6 - 23 mg/dL 7 7 9   Creatinine 0.40 - 1.50 mg/dL 0.91 0.89 0.98  Sodium 135 - 145 mEq/L 135 137 139  Potassium 3.5 - 5.1 mEq/L 3.7 3.8 3.9  Chloride 96 - 112 mEq/L 98 101 104  CO2 19 - 32 mEq/L 29 28 30   Calcium 8.4 - 10.5 mg/dL 9.6 9.4 9.6  Total Protein 6.0 - 8.3 g/dL 7.5 7.2 7.3  Total Bilirubin 0.2 - 1.2 mg/dL 0.7 0.5 0.5  Alkaline Phos 39 - 117 U/L 52 52 58  AST 0 - 37 U/L 37 32 20  ALT 0 - 53 U/L 40 35 22       ASSESSMENT & PLAN:    H. pylori infection Will check urea breath test today to ensure eradication of H pylori.  If still positive, will try Levaquin triple or quad therapy.  If that is not successful, will probably ask for repeat endoscopy to see if we can culture organism although this is challenging to do.  RTC pending breath test result.    Orders Placed This Encounter  Procedures   H. pylori breath test        [facility] 01/21/2021, 9:19 AM

## 2021-01-21 ENCOUNTER — Encounter: Payer: Self-pay | Admitting: Internal Medicine

## 2021-01-21 ENCOUNTER — Other Ambulatory Visit: Payer: Self-pay

## 2021-01-21 ENCOUNTER — Ambulatory Visit: Payer: Medicare Other | Admitting: Internal Medicine

## 2021-01-21 VITALS — BP 142/90 | HR 70 | Temp 97.2°F | Resp 16 | Wt 214.0 lb

## 2021-01-21 DIAGNOSIS — A048 Other specified bacterial intestinal infections: Secondary | ICD-10-CM

## 2021-01-21 NOTE — Patient Instructions (Signed)
Thank you for coming to see me today. It was a pleasure seeing you.  To Do: H pylori breath test today to ensure eradication of infection Okay to resume protonix Follow up pending results from today  If you have any questions or concerns, please do not hesitate to call the office at [phone].  Take Care,   Jule Ser

## 2021-01-21 NOTE — Assessment & Plan Note (Signed)
Will check urea breath test today to ensure eradication of H pylori.  If still positive, will try Levaquin triple or quad therapy.  If that is not successful, will probably ask for repeat endoscopy to see if we can culture organism although this is challenging to do.  RTC pending breath test result.

## 2021-01-22 LAB — H. PYLORI BREATH TEST: H. pylori Breath Test: NOT DETECTED

## 2021-01-24 ENCOUNTER — Telehealth: Payer: Self-pay

## 2021-01-24 NOTE — Telephone Encounter (Signed)
Results relayed to patient; no further questions/concerns.   Contessa Preuss Lorita Officer, RN

## 2021-01-24 NOTE — Telephone Encounter (Signed)
-----   Message from Mignon Pine, DO sent at 01/24/2021  7:46 AM EDT ----- Can you please let patient know that his H pylori breath test to confirm eradication was negative indicating that treatment was successful.  Can follow up PRN.  Thanks

## 2021-07-07 ENCOUNTER — Other Ambulatory Visit: Payer: Self-pay

## 2021-07-07 MED ORDER — PANTOPRAZOLE SODIUM 20 MG PO TBEC: 20.0000 mg | DELAYED_RELEASE_TABLET | Freq: Every day | ORAL | 1 refills | Status: DC

## 2021-07-29 ENCOUNTER — Encounter: Payer: Self-pay | Admitting: Physician Assistant

## 2021-07-29 ENCOUNTER — Ambulatory Visit: Payer: Medicare Other | Admitting: Physician Assistant

## 2021-07-29 VITALS — BP 130/86 | HR 69 | Ht 74.0 in | Wt 225.4 lb

## 2021-07-29 DIAGNOSIS — R1032 Left lower quadrant pain: Secondary | ICD-10-CM | POA: Diagnosis not present

## 2021-07-29 DIAGNOSIS — K219 Gastro-esophageal reflux disease without esophagitis: Secondary | ICD-10-CM

## 2021-07-29 MED ORDER — PANTOPRAZOLE SODIUM 20 MG PO TBEC: 20.0000 mg | DELAYED_RELEASE_TABLET | Freq: Every day | ORAL | 3 refills | Status: DC

## 2021-07-29 NOTE — Patient Instructions (Signed)
We have refilled your medication. Follow up in 1 year or sooner if needed. ? ?Thank you for trusting me with your gastrointestinal care!   ? ?Ellouise Newer, PA ? ? ? ? ?BMI: ? ?If you are age 67 or older, your body mass index should be between 23-30. Your Body mass index is 28.94 kg/m?Marland Kitchen If this is out of the aforementioned range listed, please consider follow up with your Primary Care Provider. ? ?If you are age 83 or younger, your body mass index should be between 19-25. Your Body mass index is 28.94 kg/m?Marland Kitchen If this is out of the aformentioned range listed, please consider follow up with your Primary Care Provider.  ? ?MY CHART: ? ?The [facility] providers would like to encourage you to use [facility] to communicate with providers for non-urgent requests or questions.  Due to long hold times on the telephone, sending your provider a message by [facility] may be a faster and more efficient way to get a response.  Please allow 48 business hours for a response.  Please remember that this is for non-urgent requests.  ? ?

## 2021-07-29 NOTE — Progress Notes (Signed)
? ?Chief Complaint: Medication refill ? ?HPI: ?   Micheal [name] is a 66 year old male with a past medical history as listed below including Crohn's disease, known to Dr. Havery Moros, who was referred to me by Chesley Noon, MD for a medication refill. ?   Crohn's history: ?Diagnosed with Crohns disease when he had ileocolonic resection in the 1990s or so for abdominal pain. Was on a sulfasalazine for a period of time, but mostly not on any maintenance therapy for years. He has not had any overt recurrence of disease. Colonoscopy in 2013, 2018, and in 2021 in which there was no evidence of active Crohns at the surgical anastomosis. He has had severe diverticulosis leading to resection of that in the past.  ?   08/02/2020 patient seen in clinic by Dr. Havery Moros for left lower quadrant pain.  At time discussed he had recurrent left lower quadrant pain for some time which seem to fluctuate but was recently progressive waking him in the morning and also had some weight loss.  CT scan December 2020 did not show a clear cause.  He was taking Bentyl which helped to slow down his bowel but did not help his pain.  Soft this is possible musculoskeletal in etiology given persistence of symptoms and work-up.  He was offered another CT.  He was given IBgard coupons.  Discussed that if CT was negative could refer to sports medicine. ?   08/08/2020 CT the abdomen pelvis with contrast showed no acute abdominal pelvic findings, small bowel and colonic diverticulosis without diverticulitis, diffuse hepatic steatosis, fat-containing left inguinal and ventral hernias and aortic atherosclerosis.  At time Dr. Havery Moros recommended a sports medicine consult if continued with pain. ?   01/21/2021 H. pylori breath test negative. ?   Today, the patient presents to clinic and tells me that he is doing fairly well as long as he takes his Pantoprazole 20 mg in the morning.  Tells me if he misses even 1 dose he has a lot of epigastric discomfort  and regurgitation.  He would like a 90-day refill. ?   We briefly discussed patient's chronic left lower quadrant pain at this point.  He tells me he is still off and on and he thinks it is related to a "tight curve" in his colon which he discussed previously with Dr. Olevia Perches years ago.  He has seen his PCP who reevaluated him for this pain and said it may be nerves.  Reminds me of his multiple colon surgeries in the past.  Tells me this pain is just the same as its always been and does not particularly bother him at the moment. ?   Denies fever, chills, weight loss or symptoms that awaken him from sleep. ? ?Prior work-up: ?Colonoscopy 05/18/2019 - The perianal and digital rectal examinations were normal. ?- The terminal ileum appeared normal. ?- There was evidence of a prior end-to-side ileo-colonic anastomosis in the ascending colon. ?This was patent and was characterized by healthy appearing mucosa. ?- There was evidence of a prior end-to-end colo-colonic anastomosis in the sigmoid colon. ?This was patent and was characterized by healthy appearing mucosa. ?- Two sessile polyps were found in the ascending colon. The polyps were 4 mm in size. ?These polyps were removed with a cold snare. Resection and retrieval were complete. ?- Scattered medium-mouthed diverticula were found in the transverse colon and left colon. ?- Anal papilla(e) were hypertrophied. Biopsies were taken with a cold forceps for histology to ?rule out AIN. ?-  The exam was otherwise without abnormality. ?  ?1. Surgical [P], colon, ascending, polyp (2) ?- BENIGN COLONIC MUCOSA (3 OF 3 FRAGMENTS) ?- NO HIGH GRADE DYSPLASIA OR MALIGNANCY IDENTIFIED ?- SEE COMMENT ?2. Surgical [P], colon, anal canal ?- LOW GRADE SQUAMOUS INTRAEPITHELIAL LESION (AIN 1; LGSIL) ?- SEE COMMENT ?  ?CT scan 03/28/19 - IMPRESSION: ?Diffuse fatty infiltration of the liver. ?Left colonic diverticulosis.  No active diverticulitis. ?Aortic atherosclerosis. ?  ?  ?EGD 06/06/20 -  ?-  The exam of the esophagus was otherwise normal. No evidence of Barrett's esophagus. ?- Diffuse mildly erythematous mucosa was found in the gastric body. ?- A single 4 mm sessile polyp was found in the gastric fundus. The polyp was removed with a ?cold biopsy forceps. Resection and retrieval were complete. ?- The exam of the stomach was otherwise normal. ?- Biopsies were taken with a cold forceps in the gastric body, at the incisura and in the gastric ?antrum for Helicobacter pylori testing. ?- The duodenal bulb and second portion of the duodenum were normal. ?  ?1. Surgical [P], gastric antrum and gastric body ?- GASTRIC ANTRAL AND OXYNTIC MUCOSA WITH HELICOBACTER PYLORI-ASSOCIATED GASTRITIS (CONFIRMED WITH IMMUNOHISTOCHEMICAL STAIN FOR H. PYLORI) ?- INTESTINAL METAPLASIA OF ANTRAL MUCOSA, NEGATIVE FOR DYSPLASIA ?2. Surgical [P], gastric polyp, polyp (1) ?- GASTRIC HYPERPLASTIC POLYP, NEGATIVE FOR INTESTINAL METAPLASIA OR DYSPLASIA ?- BACKGROUND GASTRIC MUCOSA WITH HELICOBACTER PYLORI-ASSOCIATED GASTRITIS (CONFIRMED WITH ?IMMUNOHISTOCHEMICAL STAIN) ?  ?treatment regimen for 14 days: ?Amoxicillin 1gm BID ?Clarithromycin 530m BID ?Flagyl 5026mBID ?Protonix twice daily ?  ?Stool antigen pending ? ?Past Medical History:  ?Diagnosis Date  ? Abrasion of right heel   ? healing has scab per pt  ? Adenomatous colon polyp   ? Anal fissure   ? pt believes it is 2 inches up  ? Anxiety   ? Arthritis   ? At risk for sleep apnea   ? STOP-BANG= 5    SENT TO PCP 01-31-2014  ? BPH (benign prostatic hypertrophy)   ? Chronic lower back pain   ? [place]  ? no recurrence  ? Depression   ? Diverticulosis of colon   ? GERD (gastroesophageal reflux disease)   ? Hepatic steatosis   ? History of colon polyps   ? tubular adenoma  ? History of diverticulitis of colon   ? sigmoid-  s/p resection  ? History of gastric ulcer   ? Inguinal hernia   ? OSA (obstructive sleep apnea)   ? could not tolerate cpap, took cpap back  ? Sleep  apnea   ? has CPAP  ? ? ?Past Surgical History:  ?Procedure Laterality Date  ? EXCISIONAL HEMORRHOIDECTOMY  age 66? INCISIONAL HERNIA REPAIR  2000  ? and Left Inguinal Hernia repair  ? KNEE ARTHROSCOPY Bilateral X4  last one  2011  ? LAPAROSCOPIC PARTIAL COLECTOMY N/A 06/09/2012  ? Procedure: LAPAROSCOPIC assisted PARTIAL COLECTOMY open ;  Surgeon: ToOdis HollingsheadMD;  Location: [facility];  Service: General;  Laterality: N/A;  diverticulitis  ? [place]  [place]  ? L4 -- L5  ? MASS EXCISION N/A 06/30/2019  ? Procedure: EXCISION OF ANAL PAPILLA, EXCISION OF ANAL CANAL LESION;  Surgeon: ThLeighton RuffMD;  [facility] Service: General;  Laterality: N/A;  ? POPLITEAL SYNOVIAL CYST EXCISION Bilateral 2001  ? RECTAL EXAM UNDER ANESTHESIA N/A 06/30/2019  ? Procedure: ANORECTAL EXAM UNDER ANESTHESIA;  Surgeon: ThLeighton RuffMD;  [facility];  Service: General;  Laterality: N/A;  ? TERMINAL ILEOCOLECTOMY  1993  ? and Appendectomy  for CROHN'S  ? TONSILLECTOMY AND ADENOIDECTOMY  as child  ? TRANSURETHRAL RESECTION OF PROSTATE N/A 02/05/2014  ? Procedure: TRANSURETHRAL RESECTION OF THE PROSTATE WITH GYRUS INSTRUMENTS;  Surgeon: Bernestine Amass, MD;  Location: [facility];  Service: Urology;  Laterality: N/A;  ? ? ?Current Outpatient Medications  ?Medication Sig Dispense Refill  ? dicyclomine (BENTYL) 10 MG capsule Take 1 capsule (10 mg total) by mouth every 8 (eight) hours as needed for spasms. 60 capsule 3  ? FLUoxetine (PROZAC) 10 MG tablet Take 20 mg by mouth at bedtime.    ? gabapentin (NEURONTIN) 300 MG capsule 600 mg at bedtime.  1  ? oxyCODONE-acetaminophen (PERCOCET) 10-325 MG per tablet Take 1 tablet by mouth every 4 (four) hours as needed for pain.    ? pantoprazole (PROTONIX) 20 MG tablet Take 1 tablet (20 mg total) by mouth daily. Please call and schedule a yearly Follow up appointment for further refills. Thank you 30 tablet 1  ? Tamsulosin  HCl (FLOMAX) 0.4 MG CAPS Take 0.4 mg by mouth in the morning and at bedtime.    ? ?No current facility-administered medications for this visit.  ? ? ?Allergies as of 07/29/2021 - Review Complete 10/18

## 2021-07-30 NOTE — Progress Notes (Signed)
Agree with assessment and plan as outlined.  

## 2021-12-02 ENCOUNTER — Other Ambulatory Visit: Payer: Self-pay

## 2021-12-02 ENCOUNTER — Emergency Department (HOSPITAL_COMMUNITY): Payer: Medicare Other

## 2021-12-02 ENCOUNTER — Emergency Department (HOSPITAL_COMMUNITY)
Admission: EM | Admit: 2021-12-02 | Discharge: 2021-12-02 | Disposition: A | Discharge status: Home / Self Care | Payer: Medicare Other | Attending: Student | Admitting: Student

## 2021-12-02 ENCOUNTER — Encounter (HOSPITAL_COMMUNITY): Payer: Self-pay

## 2021-12-02 DIAGNOSIS — G5601 Carpal tunnel syndrome, right upper limb: Secondary | ICD-10-CM | POA: Insufficient documentation

## 2021-12-02 DIAGNOSIS — Z87891 Personal history of nicotine dependence: Secondary | ICD-10-CM | POA: Insufficient documentation

## 2021-12-02 MED ORDER — LORAZEPAM 1 MG PO TABS
1.0000 mg | ORAL_TABLET | Freq: Once | ORAL | Status: AC
  Administered 2021-12-02: 1 mg via ORAL
  Filled 2021-12-02: qty 1

## 2021-12-02 NOTE — ED Notes (Signed)
Patient transported to MRI 

## 2021-12-02 NOTE — ED Triage Notes (Signed)
Pt reports having a fall about 2 months ago where he landed on his back. Pt endorses continued back pain, bilateral arm and hand pain. Pt reports having trouble gripping things due to swollen joints.

## 2021-12-02 NOTE — ED Provider Notes (Signed)
[facility] DEPT Provider Note  CSN: [id number] Arrival date & time: 12/02/21 0321  Chief Complaint(s) Fall and Back Pain  HPI Micheal [name] is a 66 y.o. male with PMH chronic low back pain with multiple previous back surgeries who presents emergency department for evaluation of multiple complaints including back pain, bilateral hand swelling and weakness.  He states that he had a fall many weeks ago and has since been seen in the outpatient orthopedic clinic for worsening bilateral right greater than left hand swelling and decreased functional ability both hands where he ultimately had a EMG conduction studies that are showing severe carpal tunnel syndrome on the right hand as well as cubital tunnel syndrome.  Dr. Greta Doom of hand surgery does not feel the patient would be an appropriate surgical candidate and was recommended physical therapy, but the patient states that his symptoms have simply gotten out of control and comes to the emergency department for help.  He also endorses worsening left lower extremity numbness and low back pain that has been present for a few weeks.  Denies chest pain, shortness of breath, abdominal pain, nausea, vomiting or other systemic symptoms.   Past Medical History Past Medical History:  Diagnosis Date   Abrasion of right heel    healing has scab per pt   Adenomatous colon polyp    Anal fissure    pt believes it is 2 inches up   Anxiety    Arthritis    At risk for sleep apnea    STOP-BANG= 5    SENT TO PCP 01-31-2014   BPH (benign prostatic hypertrophy)    Chronic lower back pain    Crohn's disease ([place])    no recurrence   Depression    Diverticulosis of colon    GERD (gastroesophageal reflux disease)    Hepatic steatosis    History of colon polyps    tubular adenoma   History of diverticulitis of colon    sigmoid-  s/p resection   History of gastric ulcer    Inguinal hernia    OSA (obstructive sleep apnea)     could not tolerate cpap, took cpap back   Sleep apnea    has CPAP   Patient Active Problem List   Diagnosis Date Noted   H. pylori infection 12/03/2020   Abdominal pain, left lower quadrant 05/04/2018   Generalized abdominal pain 06/16/2016   Perianal irritation 06/16/2016   BPH with urinary obstruction 02/05/2014   Left groin pain 10/23/2013   Sigmoid diverticulitis-recurrent s/p laparoscopic sigmoid colectomy 06/09/12 05/26/2012   S/P right hemicolectomy 04/01/2012   Hx of adenomatous colonic polyps 04/01/2012   Anxiety 07/31/2010   BPH (benign prostatic hypertrophy) 07/31/2010   Chronic back pain 07/31/2010   GERD 01/29/2009   CROHN'S DISEASE 01/23/2009   DIVERTICULOSIS OF COLON 01/23/2009   Home Medication(s) Prior to Admission medications   Medication Sig Start Date End Date Taking? Authorizing Provider  dicyclomine (BENTYL) 10 MG capsule Take 1 capsule (10 mg total) by mouth every 8 (eight) hours as needed for spasms. 10/14/20   Armbruster, Carlota Raspberry, MD  FLUoxetine (PROZAC) 10 MG tablet Take 20 mg by mouth at bedtime.    [provider]  gabapentin (NEURONTIN) 300 MG capsule 600 mg at bedtime. 06/01/16   [provider]  oxyCODONE-acetaminophen (PERCOCET) 10-325 MG per tablet Take 1 tablet by mouth every 4 (four) hours as needed for pain.    [provider]  pantoprazole (PROTONIX) 20  MG tablet Take 1 tablet (20 mg total) by mouth daily. 07/29/21   Levin Erp, PA  Tamsulosin HCl (FLOMAX) 0.4 MG CAPS Take 0.4 mg by mouth in the morning and at bedtime.    [provider]                                                                                                                                    Past Surgical History Past Surgical History:  Procedure Laterality Date   EXCISIONAL HEMORRHOIDECTOMY  age 66   [place]  2000   and Left Inguinal Hernia repair   KNEE ARTHROSCOPY Bilateral X4  last one  2011    LAPAROSCOPIC PARTIAL COLECTOMY N/A 06/09/2012   Procedure: LAPAROSCOPIC assisted PARTIAL COLECTOMY open ;  Surgeon: Odis Hollingshead, MD;  Location: [facility];  Service: General;  Laterality: N/A;  diverticulitis   LUMBAR [place]   L4 -- L5   MASS EXCISION N/A 06/30/2019   Procedure: EXCISION OF ANAL PAPILLA, EXCISION OF ANAL CANAL LESION;  Surgeon: Leighton Ruff, MD;  Location: [facility];  Service: General;  Laterality: N/A;   POPLITEAL SYNOVIAL CYST EXCISION Bilateral 2001   RECTAL EXAM UNDER ANESTHESIA N/A 06/30/2019   Procedure: ANORECTAL EXAM UNDER ANESTHESIA;  Surgeon: Leighton Ruff, MD;  Location: [facility];  Service: General;  Laterality: N/A;   [place]   and Appendectomy  for CROHN'S   TONSILLECTOMY AND ADENOIDECTOMY  as child   TRANSURETHRAL RESECTION OF PROSTATE N/A 02/05/2014   Procedure: TRANSURETHRAL RESECTION OF THE PROSTATE WITH GYRUS INSTRUMENTS;  Surgeon: Bernestine Amass, MD;  Location: [facility];  Service: Urology;  Laterality: N/A;   Family History Family History  Problem Relation Age of Onset   Heart disease Father    Cirrhosis Mother        alcohol induced   Colon cancer Neg Hx    Esophageal cancer Neg Hx    Rectal cancer Neg Hx    Stomach cancer Neg Hx     Social History Social History   Tobacco Use   Smoking status: Former    Types: Cigars    Quit date: 02/02/2004    Years since quitting: 17.8   Smokeless tobacco: Never  Vaping Use   Vaping Use: Never used  Substance Use Topics   Alcohol use: No   Drug use: Not Currently    Types: Marijuana   Allergies Codeine  Review of Systems Review of Systems  Musculoskeletal:  Positive for arthralgias and joint swelling.  Neurological:  Positive for numbness.    Physical Exam Vital Signs  I have reviewed the triage vital signs BP (!) 166/120   Pulse 67   Temp 97.9 F (36.6 C) (Oral)   Resp 18   SpO2 95%   Physical  Exam Constitutional:      General: He is  not in acute distress.    Appearance: Normal appearance.  HENT:     Head: Normocephalic and atraumatic.     Nose: No congestion or rhinorrhea.  Eyes:     General:        Right eye: No discharge.        Left eye: No discharge.     Extraocular Movements: Extraocular movements intact.     Pupils: Pupils are equal, round, and reactive to light.  Cardiovascular:     Rate and Rhythm: Normal rate and regular rhythm.     Heart sounds: No murmur heard. Pulmonary:     Effort: No respiratory distress.     Breath sounds: No wheezing or rales.  Abdominal:     General: There is no distension.     Tenderness: There is no abdominal tenderness.  Musculoskeletal:        General: Swelling present. Normal range of motion.     Cervical back: Normal range of motion.  Skin:    General: Skin is warm and dry.  Neurological:     General: No focal deficit present.     Mental Status: He is alert.     Sensory: Sensory deficit present.     ED Results and Treatments Labs (all labs ordered are listed, but only abnormal results are displayed) Labs Reviewed - No data to display                                                                                                                        Radiology MR LUMBAR SPINE WO CONTRAST  Result Date: 12/02/2021 CLINICAL DATA:  Myelopathy, acute, lumbar spine. Continue low back pain after a fall 2 months ago. EXAM: MRI LUMBAR SPINE WITHOUT CONTRAST TECHNIQUE: Multiplanar, multisequence MR imaging of the lumbar spine was performed. No intravenous contrast was administered. COMPARISON:  Lumbar spine CT 12/02/2021 and MRI 07/15/2011 FINDINGS: Segmentation:  Standard. Alignment:  Trace retrolisthesis of L2 on L3 and L3 on L4. Vertebrae: No fracture, suspicious marrow lesion, or significant marrow edema. Multilevel Modic type 2 degenerative endplate changes, greatest on the left at L3-4. Conus medullaris and cauda equina: Conus  extends to the T12-L1 level. Conus and cauda equina appear normal. Paraspinal and other soft tissues: Postoperative changes in the posterior lumbar soft tissues with left greater than right paraspinal muscle atrophy. No fluid collection. Disc levels: T12-L1: Negative. L1-2: Minimal disc bulging and mild left facet hypertrophy without stenosis. L2-3: Disc desiccation. Disc bulging, a small right foraminal disc protrusion, prominent dorsal epidural fat, and mild facet and ligamentum flavum hypertrophy result in borderline to mild spinal and lateral recess stenosis and mild right neural foraminal stenosis, progressed from 2013. L3-4: Disc desiccation and severe disc space narrowing. Retrolisthesis with bulging uncovered disc, endplate spurring, and mild facet hypertrophy result in mild left lateral recess stenosis and mild left neural foraminal stenosis without spinal stenosis, similar to the 2013 MRI. L4-5: Disc desiccation and severe disc space narrowing. Disc bulging, endplate  spurring, and mild facet hypertrophy result in mild bilateral neural foraminal stenosis without spinal stenosis, similar to the prior MRI. L5-S1: Disc desiccation and moderate disc space narrowing. Small, a chronically calcified left paracentral disc protrusion, mild disc bulging, and mild facet hypertrophy without stenosis, similar to the prior MRI. IMPRESSION: 1. Progressive disc degeneration and posterior element hypertrophy at L2-3 since 2013 with borderline to mild spinal stenosis and mild right neural foraminal stenosis. 2. Unchanged mild left lateral recess and left neural foraminal stenosis at L3-4 and mild bilateral neural foraminal stenosis at L4-5. Electronically Signed   By: Logan Bores M.D.   On: 12/02/2021 15:12   MR Cervical Spine Wo Contrast  Result Date: 12/02/2021 CLINICAL DATA:  Cervical radiculopathy, no red flags EXAM: MRI CERVICAL SPINE WITHOUT CONTRAST TECHNIQUE: Multiplanar, multisequence MR imaging of the cervical  spine was performed. No intravenous contrast was administered. COMPARISON:  None Available. FINDINGS: Motion artifact is present. Alignment: No significant listhesis. Vertebrae: Vertebral body heights are maintained. No marrow edema. No suspicious osseous lesion. Cord: No abnormal signal. Posterior Fossa, vertebral arteries, paraspinal tissues: Unremarkable. Disc levels: C2-C3:  No canal or foraminal stenosis. C3-C4: Trace central protrusion. Right uncovertebral and facet hypertrophy. No canal or foraminal stenosis. C4-C5: Trace central protrusion. Right greater than left uncovertebral and facet hypertrophy. No canal or foraminal stenosis. C5-C6: Trace disc bulge. Uncovertebral hypertrophy. No canal or foraminal stenosis. C6-C7:  Trace disc bulge.  No canal or foraminal stenosis. C7-T1:  No canal or foraminal stenosis. IMPRESSION: Minor degenerative changes without significant stenosis. Electronically Signed   By: Macy Mis M.D.   On: 12/02/2021 15:01   CT Lumbar Spine Wo Contrast  Result Date: 12/02/2021 CLINICAL DATA:  Progressive back pain since falling 2 months ago. EXAM: CT LUMBAR SPINE WITHOUT CONTRAST TECHNIQUE: Multidetector CT imaging of the lumbar spine was performed without intravenous contrast administration. Multiplanar CT image reconstructions were also generated. RADIATION DOSE REDUCTION: This exam was performed according to the departmental dose-optimization program which includes automated exposure control, adjustment of the mA and/or kV according to patient size and/or use of iterative reconstruction technique. COMPARISON:  CT abdomen and pelvis 08/07/2020. Lumbar MRI 07/16/2011. FINDINGS: Segmentation: There are 5 lumbar type vertebral bodies. Alignment: Normal. Vertebrae: No evidence of acute fracture or traumatic subluxation. Chronic endplate degenerative changes at the lower 3 levels. Paraspinal and other soft tissues: No acute paraspinal findings. Aortic and branch vessel  atherosclerosis. Disc levels: Relative preservation of the disc heights at T12-L1, L1-2 and L2-3 with mild disc bulging. No significant spinal stenosis or foraminal narrowing. L3-4: Chronic degenerative disc disease with loss of disc height, disc bulging and endplate osteophytes. Similar mild narrowing of the left lateral recess and left foramen to previous MRI. L4-5: Status post left laminectomy. Chronic degenerative disc disease with loss of disc height, disc bulging and endplate osteophytes. No significant spinal stenosis. L5-S1: Chronic left paracentral disc protrusion with covering spur abutting the left S1 nerve root. Mild facet hypertrophy. No spinal stenosis or significant foraminal narrowing. IMPRESSION: 1. No acute lumbar spine findings. 2. Chronic degenerative disc disease at the lower 3 levels, similar to remote lumbar MRI. No acute findings or explanation for the patient's symptoms. 3.  Aortic Atherosclerosis (ICD10-I70.0). Electronically Signed   By: Richardean Sale M.D.   On: 12/02/2021 09:26   CT Cervical Spine Wo Contrast  Result Date: 12/02/2021 CLINICAL DATA:  66 year old male with neck trauma; neck pain EXAM: CT CERVICAL SPINE WITHOUT CONTRAST TECHNIQUE: Multidetector CT imaging of the  cervical spine was performed without intravenous contrast. Multiplanar CT image reconstructions were also generated. RADIATION DOSE REDUCTION: This exam was performed according to the departmental dose-optimization program which includes automated exposure control, adjustment of the mA and/or kV according to patient size and/or use of iterative reconstruction technique. COMPARISON:  None Available. FINDINGS: Alignment: Normal. Skull base and vertebrae: No acute fracture. No primary bone lesion or focal pathologic process. Soft tissues and spinal canal: No prevertebral fluid or swelling. No visible canal hematoma. Disc levels: Mild multilevel spondylosis, disc space height loss, and degenerative endplate changes  greatest at C7-T1. Mild-moderate cervical facet arthropathy. Spinal canal narrowing is at most mild secondary to multilevel posterior disc osteophyte complexes. No high-grade neural foraminal narrowing. Upper chest: Paraseptal emphysema. Other: None. IMPRESSION: No acute fracture in the cervical spine. Electronically Signed   By: Placido Sou M.D.   On: 12/02/2021 09:08    Pertinent labs & imaging results that were available during my care of the patient were reviewed by me and considered in my medical decision making (see MDM for details).  Medications Ordered in ED Medications  LORazepam (ATIVAN) tablet 1 mg (1 mg Oral Given 12/02/21 1305)                                                                                                                                     Procedures Procedures  (including critical care time)  Medical Decision Making / ED Course   This patient presents to the ED for concern of bilateral hand swelling, decreased functional ability of the hands and left leg numbness with back pain, this involves an extensive number of treatment options, and is a complaint that carries with it a high risk of complications and morbidity.  The differential diagnosis includes cervical stenosis, lumbar stenosis, progression of underlying cubital tunnel and carpal tunnel, fracture  MDM: Patient seen emergency department for multiple complaints described above.  Physical exam with bilateral hand swelling right greater than left and subjective numbness on the lateral aspect of the left thigh.  CT imaging of the C-spine and L-spine were obtained in triage that show chronic degenerative changes but no acute pathology.  Due to patient's apparent neurologic deficits, an MRI of the C and L-spine were obtained that shows no cervical stenosis, and progressive disc degeneration at L2-L3 with mild spinal stenosis and mild right neuroforaminal stenosis as well as unchanged mild left lateral recess  and left neural foraminal stenosis at L3-L4, L4-L5.  These findings do not explain the patient's upper extremity swelling and his presentation does appear to be consistent with the EMG conduction studies.  I spoke with the orthopedic physician assistant on-call for hand surgery Hilbert Odor who states that the patient will need to follow-up for physical therapy as requested.  Patient then discharged.   Additional history obtained: -Additional history obtained from wife -External records from outside source obtained and reviewed including: Chart review  including previous notes, labs, imaging, consultation notes   Imaging Studies ordered: I ordered imaging studies including CT C and L-spine, MRI C and L-spine I independently visualized and interpreted imaging. I agree with the radiologist interpretation   Medicines ordered and prescription drug management: Meds ordered this encounter  Medications   LORazepam (ATIVAN) tablet 1 mg    -I have reviewed the patients home medicines and have made adjustments as needed  Critical interventions none  Consultations Obtained: I requested consultation with the hand surgery team,  and discussed lab and imaging findings as well as pertinent plan - they recommend: Outpatient follow-up   Cardiac Monitoring: The patient was maintained on a cardiac monitor.  I personally viewed and interpreted the cardiac monitored which showed an underlying rhythm of: NSR  Social Determinants of Health:  Factors impacting patients care include: none   Reevaluation: After the interventions noted above, I reevaluated the patient and found that they have :stayed the same  Co morbidities that complicate the patient evaluation  Past Medical History:  Diagnosis Date   Abrasion of right heel    healing has scab per pt   Adenomatous colon polyp    Anal fissure    pt believes it is 2 inches up   Anxiety    Arthritis    At risk for sleep apnea    STOP-BANG= 5     SENT TO PCP 01-31-2014   BPH (benign prostatic hypertrophy)    Chronic lower back pain    Crohn's disease ([place])    no recurrence   Depression    Diverticulosis of colon    GERD (gastroesophageal reflux disease)    Hepatic steatosis    History of colon polyps    tubular adenoma   History of diverticulitis of colon    sigmoid-  s/p resection   History of gastric ulcer    Inguinal hernia    OSA (obstructive sleep apnea)    could not tolerate cpap, took cpap back   Sleep apnea    has CPAP      Dispostion: I considered admission for this patient, but he does not meet inpatient criteria for admission he is safe for discharge with outpatient follow-up     Final Clinical Impression(s) / ED Diagnoses Final diagnoses:  Carpal tunnel syndrome of right wrist     @PCDICTATION @    Teressa Lower, MD 12/02/21 2053

## 2021-12-02 NOTE — ED Provider Triage Note (Signed)
Emergency Medicine Provider Triage Evaluation Note  Micheal [name] , a 66 y.o. male  was evaluated in triage.  Pt complains of back pain, neck pain, joint swelling.  Patient reports that 2 months ago he had a mechanical fall where he fell backwards on his porch landing on his lumbar spine.  Patient states that since then he has a progressively worsening pain.  Patient denies red flag symptoms of low back pain.  Patient also complaining of neck pain that he stated developed after he fell.  The patient is seen by chronic pain clinic as well as Ortho for joint swelling.  Patient reports that he is having increased joint swelling after fall.  Patient denies any fevers, nausea or vomiting, headache.  Review of Systems  Positive:  Negative:   Physical Exam  BP (!) 152/93 (BP Location: Left Arm)   Pulse 90   Temp 98.2 F (36.8 C) (Oral)   Resp 16   SpO2 96%  Gen:   Awake, no distress   Resp:  Normal effort  MSK:   Moves extremities without difficulty  Other:    Medical Decision Making  Medically screening exam initiated at 8:31 AM.  Appropriate orders placed.  [name] was informed that the remainder of the evaluation will be completed by another provider, this initial triage assessment does not replace that evaluation, and the importance of remaining in the ED until their evaluation is complete.     Azucena Cecil, [place] 12/02/21 [phone]

## 2022-01-28 IMAGING — CT CT ABD-PELV W/ CM
2 of 5 series · 16 of 46 positions shown, 18 images · IV contrast (omnipaque)
Comparison: CT March 28, 2019

CLINICAL DATA: Chronic left lower quadrant abdominal pain.

EXAM:
CT ABDOMEN AND PELVIS WITH CONTRAST
TECHNIQUE: Multidetector CT imaging of the abdomen and pelvis was performed
using the standard protocol following bolus administration of
intravenous contrast.
CONTRAST:  100mL OMNIPAQUE IOHEXOL 300 MG/ML  SOLN

[Series 2: abd/pel w · axial · 0.82mm/px · z∈[-526,-101]mm · 13 of 97 slices shown, 15 images]
[im 6/97  soft-tissue]
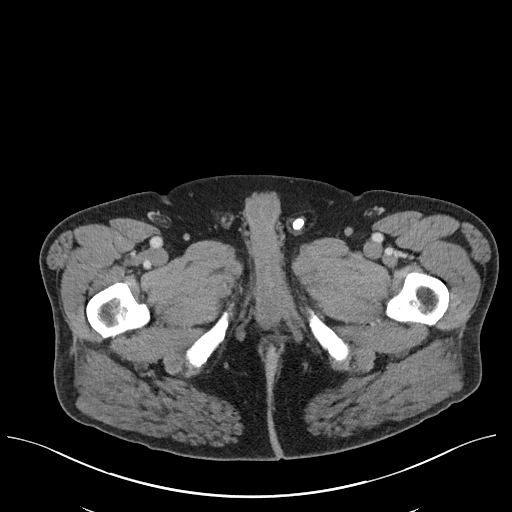
[im 6/97  bone]
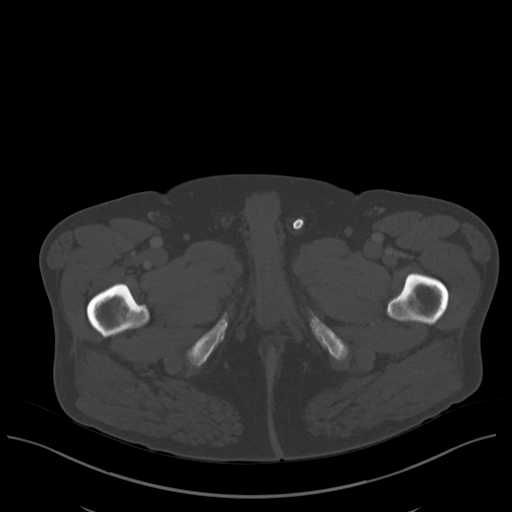
[im 16/97  soft-tissue]
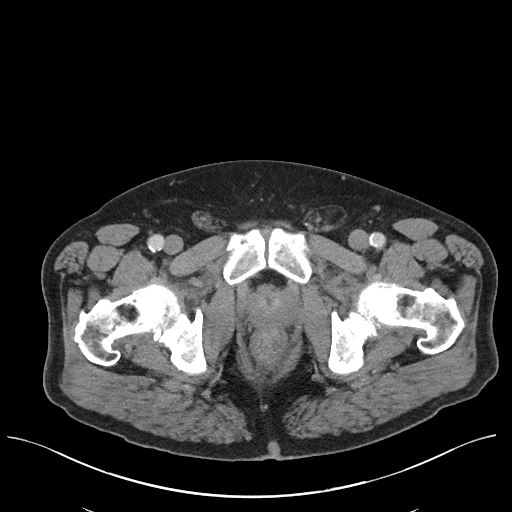
[im 21/97  soft-tissue]
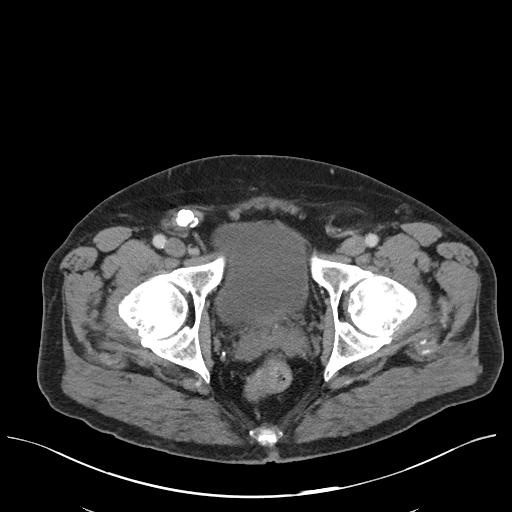
[im 26/97  soft-tissue]
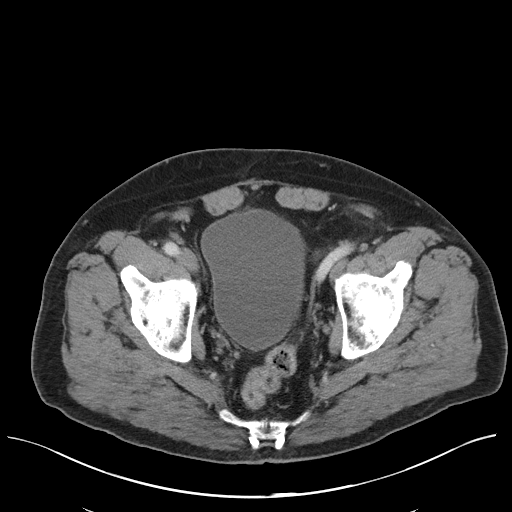
[im 36/97  soft-tissue]
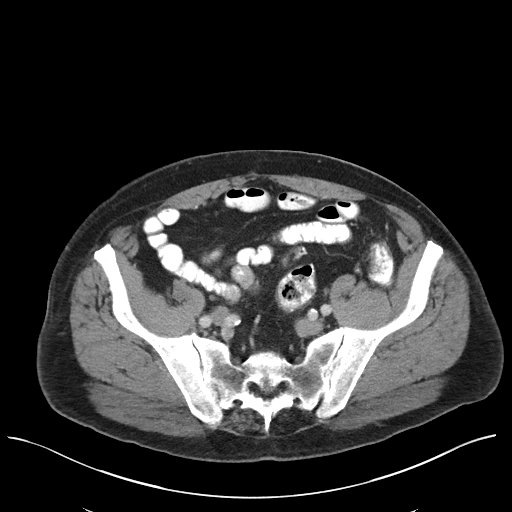
[im 41/97  soft-tissue]
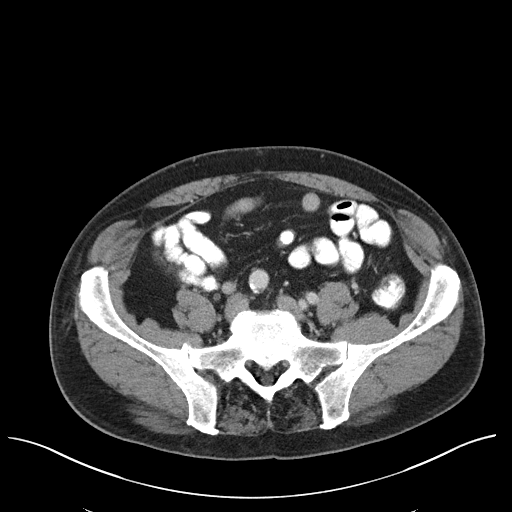
[im 51/97  soft-tissue]
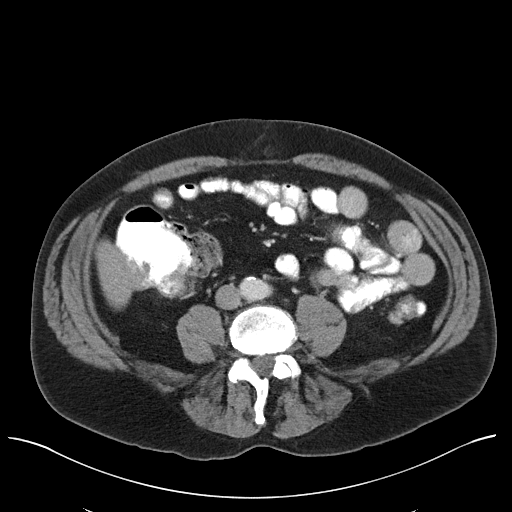
[im 56/97  soft-tissue]
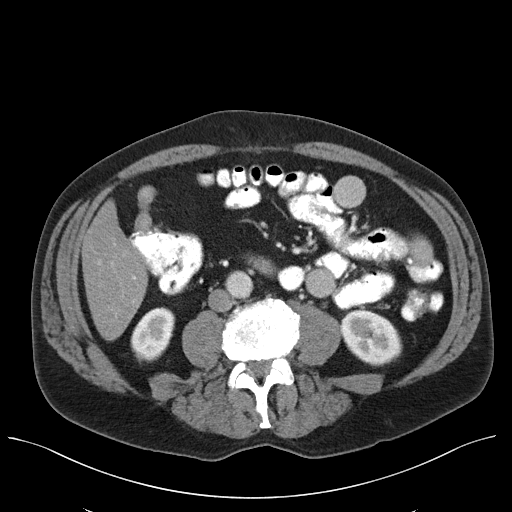
[im 61/97  soft-tissue]
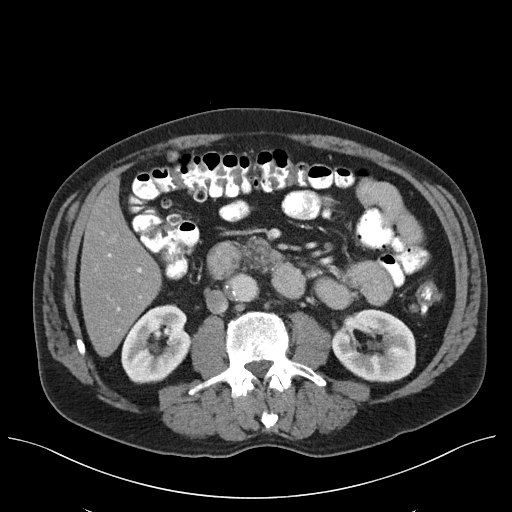
[im 61/97  bone]
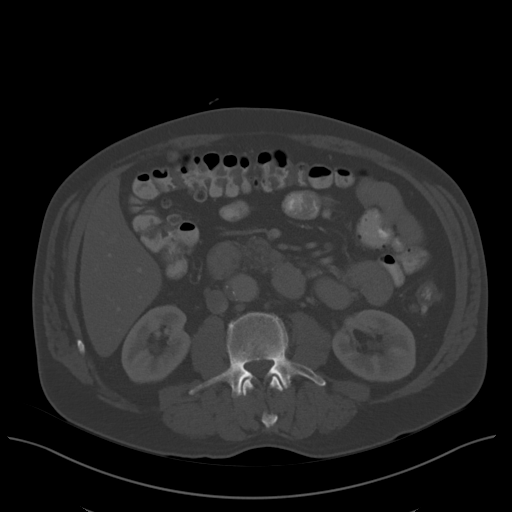
[im 71/97  soft-tissue]
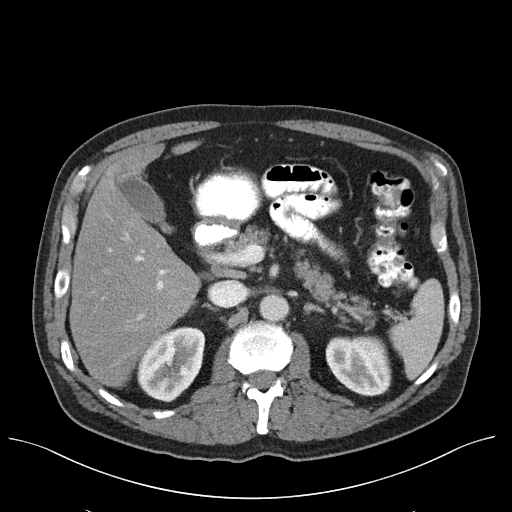
[im 76/97  soft-tissue]
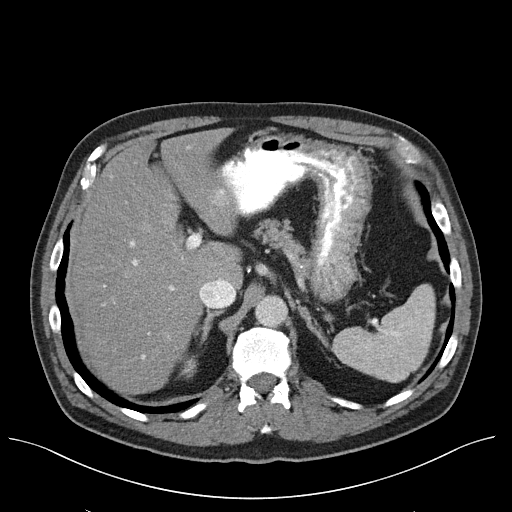
[im 81/97  soft-tissue]
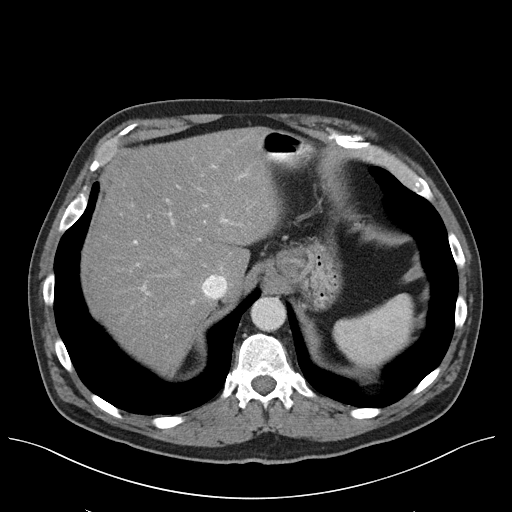
[im 91/97  soft-tissue]
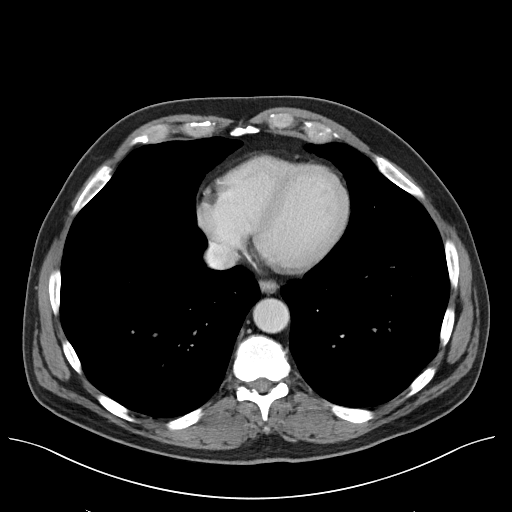

[Series 5: coronal st · coronal · 0.76mm/px · 3 of 90 slices shown]
[im 30/90  soft-tissue]
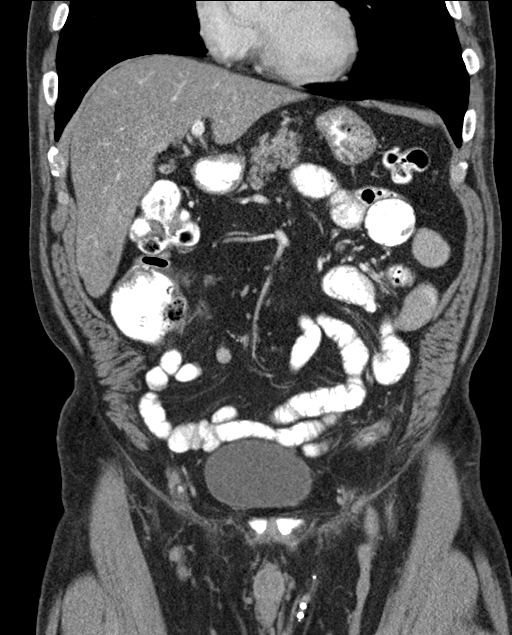
[im 40/90  soft-tissue]
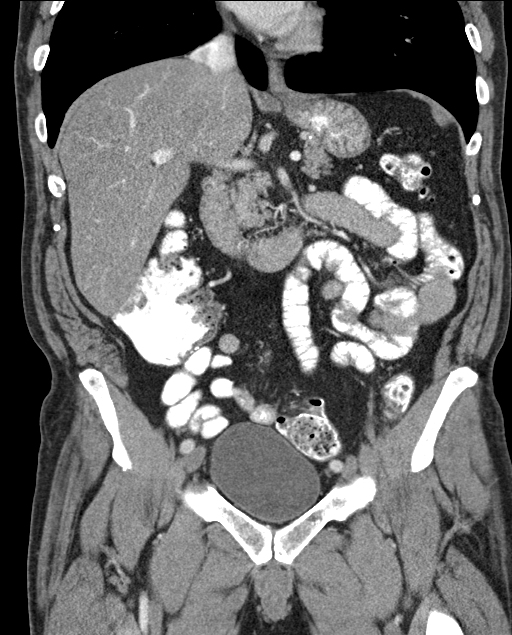
[im 50/90  soft-tissue]
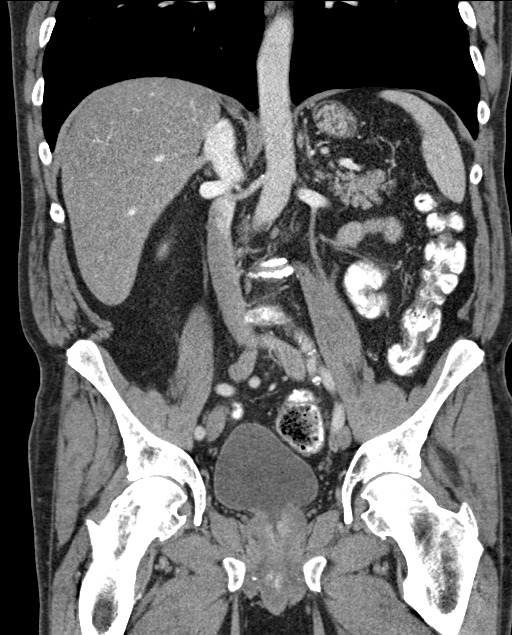

[16 of 46 positions shown; findings below may reference images not displayed]

FINDINGS: Lower chest: No acute abnormality.

Hepatobiliary: Diffuse hepatic steatosis with some focal sparing
along the gallbladder fossa. Gallbladder is grossly unremarkable. No
biliary ductal dilation.

Pancreas: Unremarkable. No pancreatic ductal dilatation or
surrounding inflammatory changes.

Spleen: Normal in size without focal abnormality.

Adrenals/Urinary Tract: Adrenal glands are unremarkable. Kidneys are
normal, without renal calculi, focal lesion, or hydronephrosis.
Bladder is unremarkable.

Stomach/Bowel: Radiopaque enteric contrast visualized to the level
of the descending colon. Stomach is grossly unremarkable for degree
of distension. Normal positioning of the duodenum/ligament of
[place]. Small bowel diverticulosis without findings of acute
diverticulitis. Appendix appears surgically absent. Sutures along
the distal cecum. Colonic diverticulosis without findings of acute
diverticulitis. Anastomotic sutures in the mid sigmoid colon

Vascular/Lymphatic: Aortic atherosclerosis without aneurysmal
dilation. No enlarged abdominal or pelvic lymph nodes.

Reproductive: Mild prostatic enlargement with stable changes of
probable prior TURP

Other: No abdominopelvic ascites. Small fat containing ventral
hernia. Similar calcifications in the right inguinal canal.

Musculoskeletal: Multilevel degenerative changes spine. No acute
osseous abnormality. Degenerative change of the bilateral hips and
SI joints.
IMPRESSION: 1. No acute abdominopelvic findings.
2. Small bowel and colonic diverticulosis without findings of acute
diverticulitis.
3. Diffuse hepatic steatosis.
4. Fat containing left inguinal and ventral hernias.
5. Aortic atherosclerosis.

Aortic Atherosclerosis ([id number]-[id number].D).

## 2022-09-21 ENCOUNTER — Telehealth: Payer: Self-pay | Admitting: Physician Assistant

## 2022-09-21 MED ORDER — PANTOPRAZOLE SODIUM 20 MG PO TBEC: 20.0000 mg | DELAYED_RELEASE_TABLET | Freq: Every day | ORAL | 0 refills | Status: DC

## 2022-09-21 NOTE — Telephone Encounter (Signed)
Script sent to pharmacy.

## 2022-09-21 NOTE — Telephone Encounter (Signed)
PT is calling to have protonix refilled. He has scheduled a FU appointment for August but he will run out by then. RX should be sent to [facility] in [place].

## 2022-12-04 ENCOUNTER — Ambulatory Visit: Payer: Medicare Other | Admitting: Physician Assistant

## 2022-12-04 ENCOUNTER — Encounter: Payer: Self-pay | Admitting: Physician Assistant

## 2022-12-04 VITALS — BP 118/70 | HR 70 | Ht 74.0 in | Wt 203.0 lb

## 2022-12-04 DIAGNOSIS — K219 Gastro-esophageal reflux disease without esophagitis: Secondary | ICD-10-CM | POA: Diagnosis not present

## 2022-12-04 MED ORDER — PANTOPRAZOLE SODIUM 20 MG PO TBEC: 20.0000 mg | DELAYED_RELEASE_TABLET | Freq: Every day | ORAL | 3 refills | Status: DC

## 2022-12-04 NOTE — Progress Notes (Signed)
Chief Complaint: Follow-up Crohn's disease.  HPI:     Micheal [name] is a 67 year old male with a past medical history as listed below including Crohn's disease, known to Dr. Adela Lank, who presents to clinic today for medication refill.  Crohn's history: Diagnosed with Crohns disease when he had ileocolonic resection in the 1990s or so for abdominal pain. Was on a sulfasalazine for a period of time, but mostly not on any maintenance therapy for years. He has not had any overt recurrence of disease. Colonoscopy in 2013, 2018, and in 2021 in which there was no evidence of active Crohns at the surgical anastomosis. He has had severe diverticulosis leading to resection of that in the past.      08/02/2020 patient seen in clinic by Dr. Adela Lank for left lower quadrant pain.  At time discussed he had recurrent left lower quadrant pain for some time which seem to fluctuate but was recently progressive waking him in the morning and also had some weight loss.  CT scan December 2020 did not show a clear cause.  He was taking Bentyl which helped to slow down his bowel but did not help his pain.  Soft this is possible musculoskeletal in etiology given persistence of symptoms and work-up.  He was offered another CT.  He was given [employer] coupons.  Discussed that if CT was negative could refer to sports medicine.    08/08/2020 CT the abdomen pelvis with contrast showed no acute abdominal pelvic findings, small bowel and colonic diverticulosis without diverticulitis, diffuse hepatic steatosis, fat-containing left inguinal and ventral hernias and aortic atherosclerosis.  At time Dr. Adela Lank recommended a sports medicine consult if continued with pain.    01/21/2021 H. pylori breath test negative.    07/29/2021 office visit with me and at that time discussed doing fairly well as long as he took Pantoprazole 20 mg in the morning, he wanted a 90-day refill.  Discussed chronic left lower quadrant pain.  It had not  changed.    Today, patient tells me that since I saw him last he was diagnosed with rheumatoid arthritis and started on Methotrexate which is given him some GI issues here and there, he is discussing this with his rheumatologist and they are contemplating possible injectable medication so that he did not have the side effect, but he tells me the longer he is on it the less GI side effects he have this.  He is asking for refill of his Pantoprazole 20 mg daily which keeps his reflux symptoms at bay.    Denies fever, chills or weight loss.  Previous Gi Workup: Colonoscopy 05/18/2019 - The perianal and digital rectal examinations were normal. - The terminal ileum appeared normal. - There was evidence of a prior end-to-side ileo-colonic anastomosis in the ascending colon. This was patent and was characterized by healthy appearing mucosa. - There was evidence of a prior end-to-end colo-colonic anastomosis in the sigmoid colon. This was patent and was characterized by healthy appearing mucosa. - Two sessile polyps were found in the ascending colon. The polyps were 4 mm in size. These polyps were removed with a cold snare. Resection and retrieval were complete. - Scattered medium-mouthed diverticula were found in the transverse colon and left colon. - Anal papilla(e) were hypertrophied. Biopsies were taken with a cold forceps for histology to rule out AIN. - The exam was otherwise without abnormality.   1. Surgical [P], colon, ascending, polyp (2) - BENIGN COLONIC MUCOSA (3 OF 3 FRAGMENTS) - NO  HIGH GRADE DYSPLASIA OR MALIGNANCY IDENTIFIED - SEE COMMENT 2. Surgical [P], colon, anal canal - LOW GRADE SQUAMOUS INTRAEPITHELIAL LESION (AIN 1; LGSIL) - SEE COMMENT   CT scan 03/28/19 - IMPRESSION: Diffuse fatty infiltration of the liver. Left colonic diverticulosis.  No active diverticulitis. Aortic atherosclerosis.     EGD 06/06/20 -  - The exam of the esophagus was otherwise normal. No evidence of  Barrett's esophagus. - Diffuse mildly erythematous mucosa was found in the gastric body. - A single 4 mm sessile polyp was found in the gastric fundus. The polyp was removed with a cold biopsy forceps. Resection and retrieval were complete. - The exam of the stomach was otherwise normal. - Biopsies were taken with a cold forceps in the gastric body, at the incisura and in the gastric antrum for Helicobacter pylori testing. - The duodenal bulb and second portion of the duodenum were normal.   1. Surgical [P], gastric antrum and gastric body - GASTRIC ANTRAL AND OXYNTIC MUCOSA WITH HELICOBACTER PYLORI-ASSOCIATED GASTRITIS (CONFIRMED WITH IMMUNOHISTOCHEMICAL STAIN FOR H. PYLORI) - INTESTINAL METAPLASIA OF ANTRAL MUCOSA, NEGATIVE FOR DYSPLASIA 2. Surgical [P], gastric polyp, polyp (1) - GASTRIC HYPERPLASTIC POLYP, NEGATIVE FOR INTESTINAL METAPLASIA OR DYSPLASIA - BACKGROUND GASTRIC MUCOSA WITH HELICOBACTER PYLORI-ASSOCIATED GASTRITIS (CONFIRMED WITH IMMUNOHISTOCHEMICAL STAIN)  Past Medical History:  Diagnosis Date   Abrasion of right heel    healing has scab per pt   Adenomatous colon polyp    Anal fissure    pt believes it is 2 inches up   Anxiety    Arthritis    At risk for sleep apnea    STOP-BANG= 5    SENT TO PCP 01-31-2014   BPH (benign prostatic hypertrophy)    Chronic lower back pain    Crohn's disease (HCC)    no recurrence   Depression    Diverticulosis of colon    GERD (gastroesophageal reflux disease)    Hepatic steatosis    History of colon polyps    tubular adenoma   History of diverticulitis of colon    sigmoid-  s/p resection   History of gastric ulcer    Inguinal hernia    OSA (obstructive sleep apnea)    could not tolerate cpap, took cpap back   Sleep apnea    has CPAP    Past Surgical History:  Procedure Laterality Date   EXCISIONAL HEMORRHOIDECTOMY  age 20   INCISIONAL HERNIA REPAIR  2000   and Left Inguinal Hernia repair   KNEE ARTHROSCOPY  Bilateral X4  last one  2011   LAPAROSCOPIC PARTIAL COLECTOMY N/A 06/09/2012   Procedure: LAPAROSCOPIC assisted PARTIAL COLECTOMY open ;  Surgeon: Adolph Pollack, MD;  Location: [facility];  Service: General;  Laterality: N/A;  diverticulitis   LUMBAR DISC SURGERY  1994   L4 -- L5   MASS EXCISION N/A 06/30/2019   Procedure: EXCISION OF ANAL PAPILLA, EXCISION OF ANAL CANAL LESION;  Surgeon: Romie Levee, MD;  Location: [facility];  Service: General;  Laterality: N/A;   POPLITEAL SYNOVIAL CYST EXCISION Bilateral 2001   RECTAL EXAM UNDER ANESTHESIA N/A 06/30/2019   Procedure: ANORECTAL EXAM UNDER ANESTHESIA;  Surgeon: Romie Levee, MD;  Location: [facility];  Service: General;  Laterality: N/A;   TERMINAL ILEOCOLECTOMY  1993   and Appendectomy  for CROHN'S   TONSILLECTOMY AND ADENOIDECTOMY  as child   TRANSURETHRAL RESECTION OF PROSTATE N/A 02/05/2014   Procedure: TRANSURETHRAL RESECTION OF THE PROSTATE WITH GYRUS INSTRUMENTS;  Surgeon: Valetta Fuller, MD;  Location: [facility];  Service: Urology;  Laterality: N/A;    Current Outpatient Medications  Medication Sig Dispense Refill   FLUoxetine (PROZAC) 10 MG tablet Take 20 mg by mouth at bedtime.     gabapentin (NEURONTIN) 300 MG capsule 600 mg at bedtime.  1   oxyCODONE-acetaminophen (PERCOCET) 10-325 MG per tablet Take 1 tablet by mouth every 4 (four) hours as needed for pain.     pantoprazole (PROTONIX) 20 MG tablet Take 1 tablet (20 mg total) by mouth daily. 90 tablet 0   Tamsulosin HCl (FLOMAX) 0.4 MG CAPS Take 0.4 mg by mouth in the morning and at bedtime.     dicyclomine (BENTYL) 10 MG capsule Take 1 capsule (10 mg total) by mouth every 8 (eight) hours as needed for spasms. (Patient not taking: Reported on 12/04/2022) 60 capsule 3   No current facility-administered medications for this visit.    Allergies as of 12/04/2022 - Review Complete 12/04/2022  Allergen Reaction Noted   Codeine  Itching 01/23/2009    Family History  Problem Relation Age of Onset   Heart disease Father    Cirrhosis Mother        alcohol induced   Colon cancer Neg Hx    Esophageal cancer Neg Hx    Rectal cancer Neg Hx    Stomach cancer Neg Hx     Social History   Socioeconomic History   Marital status: Married    Spouse name: Angie   Number of children: 1   Years of education: Not on file   Highest education level: Not on file  Occupational History   Occupation: disabled  Tobacco Use   Smoking status: Former    Types: Cigars    Quit date: 02/02/2004    Years since quitting: 18.8   Smokeless tobacco: Never  Vaping Use   Vaping status: Never Used  Substance and Sexual Activity   Alcohol use: No   Drug use: Not Currently    Types: Marijuana   Sexual activity: Not on file  Other Topics Concern   Not on file  Social History Narrative   Daily caffeine use-3   Social Determinants of Health   Financial Resource Strain: Low Risk  (09/07/2022)   Received from [facility], [facility]   Overall Financial Resource Strain (CARDIA)    Difficulty of Paying Living Expenses: Not hard at all  Food Insecurity: No Food Insecurity (09/07/2022)   Received from [facility], [facility]   Hunger Vital Sign    Worried About Running Out of Food in the Last Year: Never true    Ran Out of Food in the Last Year: Never true  Transportation Needs: No Transportation Needs (09/07/2022)   Received from [employer], [facility]   PRAPARE - Transportation    Lack of Transportation (Medical): No    Lack of Transportation (Non-Medical): No  Physical Activity: Sufficiently Active (09/07/2022)   Received from [facility], [facility]   Exercise Vital Sign    Days of Exercise per Week: 7 days    Minutes of Exercise per Session: 50 min  Stress: No Stress Concern Present (09/07/2022)   Received from [facility], [facility] - Occupational Stress  Questionnaire    Feeling of Stress : Only a little  Social Connections: Socially Integrated (09/07/2022)   Received from [facility], [facility]   Social Network    How would  you rate your social network (family, work, friends)?: Good participation with social networks  Intimate Partner Violence: Not At Risk (09/07/2022)   Received from [facility], [facility]   HITS    Over the last 12 months how often did your partner physically hurt you?: 1    Over the last 12 months how often did your partner insult you or talk down to you?: 1    Over the last 12 months how often did your partner threaten you with physical harm?: 1    Over the last 12 months how often did your partner scream or curse at you?: 1    Review of Systems:    Constitutional: No weight loss, fever or chills Cardiovascular: No chest pain Respiratory: No SOB  Gastrointestinal: See HPI and otherwise negative   Physical Exam:  Vital signs: BP 118/70   Pulse 70   Ht 6\' 2"  (1.88 m)   Wt 203 lb (92.1 kg)   BMI 26.06 kg/m    Constitutional:   Pleasant Caucasian male appears to be in NAD, Well developed, Well nourished, alert and cooperative Respiratory: Respirations even and unlabored. Lungs clear to auscultation bilaterally.   No wheezes, crackles, or rhonchi.  Cardiovascular: Normal S1, S2. No MRG. Regular rate and rhythm. No peripheral edema, cyanosis or pallor.  Gastrointestinal:  Soft, nondistended, nontender. No rebound or guarding. Normal bowel sounds. No appreciable masses or hepatomegaly. Rectal:  Not performed.  Psychiatric: Demonstrates good judgement and reason without abnormal affect or behaviors.  RELEVANT LABS AND IMAGING: CBC    Component Value Date/Time   WBC 10.3 07/26/2020 1112   RBC 5.04 07/26/2020 1112   HGB 15.0 07/26/2020 1112   HCT 43.5 07/26/2020 1112   PLT 249.0 07/26/2020 1112   MCV 86.3 07/26/2020 1112   MCH 30.0 06/10/2012 0420   MCHC 34.4 07/26/2020 1112   RDW 14.8 07/26/2020  1112   LYMPHSABS 3.6 07/26/2020 1112   MONOABS 0.9 07/26/2020 1112   EOSABS 0.1 07/26/2020 1112   BASOSABS 0.1 07/26/2020 1112    CMP     Component Value Date/Time   NA 135 07/26/2020 1112   K 3.7 07/26/2020 1112   CL 98 07/26/2020 1112   CO2 29 07/26/2020 1112   GLUCOSE 110 (H) 07/26/2020 1112   BUN 7 07/26/2020 1112   CREATININE 0.91 07/26/2020 1112   CALCIUM 9.6 07/26/2020 1112   PROT 7.5 07/26/2020 1112   ALBUMIN 4.5 07/26/2020 1112   AST 37 07/26/2020 1112   ALT 40 07/26/2020 1112   ALKPHOS 52 07/26/2020 1112   BILITOT 0.7 07/26/2020 1112   GFRNONAA 89 (L) 02/06/2014 0515   GFRAA >90 02/06/2014 0515    Assessment: 1.  GERD: Controlled on Pantoprazole 20 mg daily  Plan: 1.  Refilled Pantoprazole 20 mg daily #90 with 3 refills.  Patient can have another year of refills if he is doing well without an office visit. 2.  Next colonoscopy due in 2026 3.  Patient to follow in clinic with [place] as needed.  Hyacinth Meeker, PA-C [facility] 12/04/2022, 1:29 PM  Cc: Eartha Inch, MD

## 2022-12-04 NOTE — Patient Instructions (Signed)
We have sent the following medications to your pharmacy for you to pick up at your convenience: Pantoprazole  _______________________________________________________  If your blood pressure at your visit was 140/90 or greater, please contact your primary care physician to follow up on this.  _______________________________________________________  If you are age 68 or older, your body mass index should be between 23-30. Your Body mass index is 26.06 kg/m. If this is out of the aforementioned range listed, please consider follow up with your Primary Care Provider.  If you are age 31 or younger, your body mass index should be between 19-25. Your Body mass index is 26.06 kg/m. If this is out of the aformentioned range listed, please consider follow up with your Primary Care Provider.   ________________________________________________________  The [facility] providers would like to encourage you to use [facility] to communicate with providers for non-urgent requests or questions.  Due to long hold times on the telephone, sending your provider a message by [facility] may be a faster and more efficient way to get a response.  Please allow 48 business hours for a response.  Please remember that this is for non-urgent requests.  _______________________________________________________

## 2023-01-13 ENCOUNTER — Telehealth: Payer: Self-pay | Admitting: Physician Assistant

## 2023-01-13 MED ORDER — DICYCLOMINE HCL 10 MG PO CAPS: 10.0000 mg | ORAL_CAPSULE | Freq: Three times a day (TID) | ORAL | 3 refills | Status: AC | PRN

## 2023-01-13 NOTE — Telephone Encounter (Signed)
Inbound call from patient's wife stating they requested a refill for dicyclomine medication but have not heard anything regarding refill. Patient's wife requesting a call back to discuss further. Please advise, thank you.

## 2023-01-13 NOTE — Telephone Encounter (Signed)
Refill sent to [facility]. Patient informed.

## 2023-11-30 ENCOUNTER — Telehealth: Payer: Self-pay | Admitting: Physician Assistant

## 2023-11-30 MED ORDER — PANTOPRAZOLE SODIUM 20 MG PO TBEC: 20.0000 mg | DELAYED_RELEASE_TABLET | Freq: Every day | ORAL | 0 refills | Status: DC

## 2023-11-30 NOTE — Telephone Encounter (Signed)
 Inbound call from patient stating that he needed a refill for Protonix . Patient made an office visit with [facility] on 10/28 at 11:00 and is requesting refill to be sent. Please advise.

## 2023-11-30 NOTE — Telephone Encounter (Signed)
 Protonix refilled

## 2024-02-01 ENCOUNTER — Encounter: Payer: Self-pay | Admitting: Gastroenterology

## 2024-02-01 ENCOUNTER — Ambulatory Visit: Admitting: Gastroenterology

## 2024-02-01 VITALS — BP 124/70 | HR 91 | Ht 74.0 in | Wt 225.2 lb

## 2024-02-01 DIAGNOSIS — K5090 Crohn's disease, unspecified, without complications: Secondary | ICD-10-CM | POA: Diagnosis not present

## 2024-02-01 DIAGNOSIS — K219 Gastro-esophageal reflux disease without esophagitis: Secondary | ICD-10-CM | POA: Diagnosis not present

## 2024-02-01 MED ORDER — PANTOPRAZOLE SODIUM 20 MG PO TBEC: 20.0000 mg | DELAYED_RELEASE_TABLET | Freq: Every day | ORAL | 3 refills | Status: AC

## 2024-02-01 NOTE — Progress Notes (Signed)
 Agree with assessment and plan as outlined.

## 2024-02-01 NOTE — Patient Instructions (Addendum)
 We have sent the following medications to your pharmacy for you to pick up at your convenience: Pantoprazole  20 mg daily.  Follow up in 1-2 years or sooner if needed.   Colonoscopy due February 2026.

## 2024-02-01 NOTE — Progress Notes (Signed)
 02/01/2024 Micheal [name] [id number] [birth year]  Discussed the use of AI scribe software for clinical note transcription with the patient, who gave verbal consent to proceed.  History of Present Illness Micheal [name] is a 68 year old male with gastroesophageal reflux disease who presents for a medication refill.  Patient of Dr. Hassan.  He is currently asymptomatic with no heartburn or difficulty swallowing. He is on pantoprazole  20 mg daily and prefers a 90-day supply to minimize pharmacy visits.  He has a history of rheumatoid arthritis, initially treated with methotrexate, but switched to Humira due to gastrointestinal side effects.  He has undergone significant surgical interventions related to Crohn's disease and diverticulitis, including bowel resections.  He reports a large hernia resulting from previous surgeries, which becomes more prominent with physical strain, such as during recent motion sickness that caused retching/vomiting.  He is due for colonoscopy in the next few months.  He experiences seasonal allergies, particularly in the fall, which he attributes to exposure to allergens like ragweed during outdoor activities.  EGD  - Esophagogastric landmarks identified. - Normal esophagus otherwise - no evidence of Barrett' s esophagus - Erythematous mucosa in the gastric body. - A single gastric polyp. Resected and retrieved. - Normal stomach otherwise - biopsies taken to rule out H pylori - Normal duodenal bulb and second portion of the duodenum.  1. Surgical [P], gastric antrum and gastric body - GASTRIC ANTRAL AND OXYNTIC MUCOSA WITH HELICOBACTER PYLORI-ASSOCIATED GASTRITIS (CONFIRMED WITH IMMUNOHISTOCHEMICAL STAIN FOR H. PYLORI) - INTESTINAL METAPLASIA OF ANTRAL MUCOSA, NEGATIVE FOR DYSPLASIA 2. Surgical [P], gastric polyp, polyp (1) - GASTRIC HYPERPLASTIC POLYP, NEGATIVE FOR INTESTINAL METAPLASIA OR DYSPLASIA - BACKGROUND GASTRIC MUCOSA WITH HELICOBACTER  PYLORI-ASSOCIATED GASTRITIS (CONFIRMED WITH IMMUNOHISTOCHEMICAL STAIN) Micheal [name]  He was treated and then H. pylori breath test in October 2022 was negative.   Past Medical History:  Diagnosis Date   Abrasion of right heel    healing has scab per pt   Adenomatous colon polyp    Anal fissure    pt believes it is 2 inches up   Anxiety    Arthritis    At risk for sleep apnea    STOP-BANG= 5    SENT TO PCP 01-31-2014   BPH (benign prostatic hypertrophy)    Chronic lower back pain    Crohn's disease (HCC)    no recurrence   Depression    Diverticulosis of colon    GERD (gastroesophageal reflux disease)    Hepatic steatosis    History of colon polyps    tubular adenoma   History of diverticulitis of colon    sigmoid-  s/p resection   History of gastric ulcer    Inguinal hernia    OSA (obstructive sleep apnea)    could not tolerate cpap, took cpap back   Sleep apnea    has CPAP   Past Surgical History:  Procedure Laterality Date   EXCISIONAL HEMORRHOIDECTOMY  age 35   INCISIONAL HERNIA REPAIR  2000   and Left Inguinal Hernia repair   KNEE ARTHROSCOPY Bilateral X4  last one  2011   LAPAROSCOPIC PARTIAL COLECTOMY N/A 06/09/2012   Procedure: LAPAROSCOPIC assisted PARTIAL COLECTOMY open ;  Surgeon: Krystal JINNY Russell, MD;  Location: [facility];  Service: General;  Laterality: N/A;  diverticulitis   LUMBAR DISC SURGERY  1994   L4 -- L5   MASS EXCISION N/A 06/30/2019   Procedure: EXCISION OF ANAL PAPILLA, EXCISION OF ANAL CANAL LESION;  Surgeon: Debby Hila, MD;  Location[facility];  Service: General;  Laterality: N/A;   POPLITEAL SYNOVIAL CYST EXCISION Bilateral 2001   RECTAL EXAM UNDER ANESTHESIA N/A 06/30/2019   Procedure: ANORECTAL EXAM UNDER ANESTHESIA;  Surgeon: Debby Hila, MD;  Location: [facility];  Service: General;  Laterality: N/A;   TERMINAL ILEOCOLECTOMY  1993   and Appendectomy  for CROHN'S   TONSILLECTOMY AND ADENOIDECTOMY  as child    TRANSURETHRAL RESECTION OF PROSTATE N/A 02/05/2014   Procedure: TRANSURETHRAL RESECTION OF THE PROSTATE WITH GYRUS INSTRUMENTS;  Surgeon: Alm GORMAN Fragmin, MD;  Location: [facility];  Service: Urology;  Laterality: N/A;    reports that he quit smoking about 20 years ago. His smoking use included cigars. He has never used smokeless tobacco. He reports that he does not currently use drugs after having used the following drugs: Marijuana. He reports that he does not drink alcohol. family history includes Cirrhosis in his mother; Heart disease in his father. Allergies  Allergen Reactions   Codeine Itching      Outpatient Encounter Medications as of 02/01/2024  Medication Sig   adalimumab (HUMIRA, 2 PEN,) 40 MG/0.4ML pen Inject 40 mg into the skin every 14 (fourteen) days.   dicyclomine  (BENTYL ) 10 MG capsule Take 1 capsule (10 mg total) by mouth every 8 (eight) hours as needed for spasms.   FLUoxetine (PROZAC) 10 MG tablet Take 20 mg by mouth at bedtime.   gabapentin (NEURONTIN) 300 MG capsule 600 mg at bedtime.   methocarbamol (ROBAXIN) 500 MG tablet Take 500 mg by mouth 4 (four) times daily.   oxyCODONE -acetaminophen  (PERCOCET) 10-325 MG per tablet Take 1 tablet by mouth every 4 (four) hours as needed for pain.   pantoprazole  (PROTONIX ) 20 MG tablet Take 1 tablet (20 mg total) by mouth daily.   Tamsulosin  HCl (FLOMAX ) 0.4 MG CAPS Take 0.4 mg by mouth in the morning and at bedtime.   No facility-administered encounter medications on file as of 02/01/2024.     REVIEW OF SYSTEMS  : All other systems reviewed and negative except where noted in the History of Present Illness.   PHYSICAL EXAM: BP 124/70 (BP Location: Right Arm, Patient Position: Sitting, Cuff Size: Normal)   Pulse 91   Ht 6' 2 (1.88 m)   Wt 225 lb 4 oz (102.2 kg)   BMI 28.92 kg/m  General: Well developed white male in no acute distress Head: Normocephalic and atraumatic Eyes:  Sclerae anicteric,  conjunctiva pink. Ears: Normal auditory acuity Lungs: Clear throughout to auscultation; no W/R/R. Heart: Regular rate and rhythm; no M/R/R. Abdomen: Soft, non-distended.  BS present.  Ventral/incisional hernias noted.  Non-tender. Musculoskeletal: Symmetrical with no gross deformities  Skin: No lesions on visible extremities Neurological: Alert oriented x 4, grossly non-focal Psychological:  Alert and cooperative. Normal mood and affect.  Assessment & Plan Gastroesophageal reflux disease (GERD) No symptoms of heartburn, burping, or dysphagia.  He is on pantoprazole  20 mg daily.  Needs that refilled. - Prescribed pantoprazole  as a 90-day supply. - Follow-up in 1 to 2 years for further refills.  Crohn's disease, post-surgical Crohn's disease not on any medication for years, but diagnosed with rheumatoid arthritis so now on Humira for that. No current symptoms or need for additional Crohn's-specific medications. Previous surgeries included resection due to Crohn's and diverticulitis. - Continue Humira. - Is due for colonoscopy in February/the spring, recall is in the system.  Ventral hernia, postoperative Ventral hernia is present  on exam, exacerbated by physical activity. No need for intervention at this time.  History of H. pylori status posttreatment with a negative breath test October 2022    CC:  Sophronia Ozell BROCKS, MD

## 2024-03-06 ENCOUNTER — Telehealth: Payer: Self-pay | Admitting: Gastroenterology

## 2024-03-06 NOTE — Telephone Encounter (Signed)
 Inbound call from patient stating that he was last seen in our office for October the 28 th. Patient is stating that he never received a refill on his pantoprazole  20 MG. Patient is requesting a call back to be further advised on his medication. Please advise.

## 2024-03-06 NOTE — Telephone Encounter (Signed)
 Spoke with pt. He reports that [facility] stated they had not received the Rx that was sent on 10/28. Called [facility]. They stated they had not received the Rx for Protonix . Advised them that we sent the Rx on 10/28, and they were able to find the medication. They will get the prescription filled. Called the pt and updated them. Verbalized understanding.

## 2024-03-23 ENCOUNTER — Other Ambulatory Visit: Payer: Self-pay | Admitting: Anesthesiology

## 2024-03-23 DIAGNOSIS — M549 Dorsalgia, unspecified: Secondary | ICD-10-CM

## 2024-03-23 DIAGNOSIS — Z9889 Other specified postprocedural states: Secondary | ICD-10-CM

## 2024-04-12 ENCOUNTER — Ambulatory Visit
Admission: RE | Admit: 2024-04-12 | Discharge: 2024-04-12 | Disposition: A | Discharge status: Home / Self Care | Source: Ambulatory Visit | Attending: Anesthesiology | Admitting: Anesthesiology

## 2024-04-12 DIAGNOSIS — M549 Dorsalgia, unspecified: Secondary | ICD-10-CM

## 2024-04-12 DIAGNOSIS — Z9889 Other specified postprocedural states: Secondary | ICD-10-CM

## 2024-04-12 MED ORDER — GADOPICLENOL 0.5 MMOL/ML IV SOLN
10.0000 mL | Freq: Once | INTRAVENOUS | Status: AC | PRN
  Administered 2024-04-12: 10 mL via INTRAVENOUS
